# Patient Record
Sex: Female | Born: 1970 | Race: White | Hispanic: No | Marital: Married | Smoking: Never smoker
Health system: Southern US, Community
[De-identification: ages and names within clinical notes are randomized; demographics above are authoritative.]

## PROBLEM LIST (undated history)

## (undated) DIAGNOSIS — Z789 Other specified health status: Secondary | ICD-10-CM

## (undated) DIAGNOSIS — J189 Pneumonia, unspecified organism: Secondary | ICD-10-CM

## (undated) DIAGNOSIS — M5126 Other intervertebral disc displacement, lumbar region: Secondary | ICD-10-CM

## (undated) DIAGNOSIS — D649 Anemia, unspecified: Secondary | ICD-10-CM

## (undated) HISTORY — PX: TONSILLECTOMY: SUR1361

## (undated) HISTORY — PX: ABLATION: SHX5711

## (undated) HISTORY — PX: TUBAL LIGATION: SHX77

---

## 2004-10-13 ENCOUNTER — Inpatient Hospital Stay (HOSPITAL_COMMUNITY): Admission: AD | Admit: 2004-10-13 | Discharge: 2004-10-13 | Payer: Self-pay | Admitting: Obstetrics and Gynecology

## 2004-10-16 ENCOUNTER — Inpatient Hospital Stay (HOSPITAL_COMMUNITY): Admission: AD | Admit: 2004-10-16 | Discharge: 2004-10-16 | Payer: Self-pay | Admitting: Obstetrics and Gynecology

## 2006-03-14 ENCOUNTER — Inpatient Hospital Stay (HOSPITAL_COMMUNITY): Admission: AD | Admit: 2006-03-14 | Discharge: 2006-03-14 | Payer: Self-pay | Admitting: Obstetrics and Gynecology

## 2006-03-15 ENCOUNTER — Inpatient Hospital Stay (HOSPITAL_COMMUNITY): Admission: AD | Admit: 2006-03-15 | Discharge: 2006-03-15 | Payer: Self-pay | Admitting: Obstetrics and Gynecology

## 2006-03-24 ENCOUNTER — Inpatient Hospital Stay (HOSPITAL_COMMUNITY): Admission: AD | Admit: 2006-03-24 | Discharge: 2006-03-28 | Payer: Self-pay | Admitting: Obstetrics & Gynecology

## 2006-03-25 ENCOUNTER — Encounter (INDEPENDENT_AMBULATORY_CARE_PROVIDER_SITE_OTHER): Payer: Self-pay | Admitting: Specialist

## 2006-10-01 IMAGING — US US OB COMP LESS 14 WK
1 series · 13 of 28 positions shown · non-contrast
Comparison: none

CLINICAL DATA: 33- year-old with inappropriate rise in quantitative beta hCG.  uantitative beta hCG is 5244.  
OBSTETRICAL ULTRASOUND <14 WKS AND TRANSVAGINAL OB US:
TECHNIQUE: Both transabdominal and transvaginal ultrasound examinations were performed for complete evaluation of the gestation as well as the maternal uterus, adnexal regions, and pelvic cul-de-sac.

[Series 1: us ob comp less 14 wk · 0.29mm/px · 62 acquisitions, 13 frames shown]
[im 3/62]
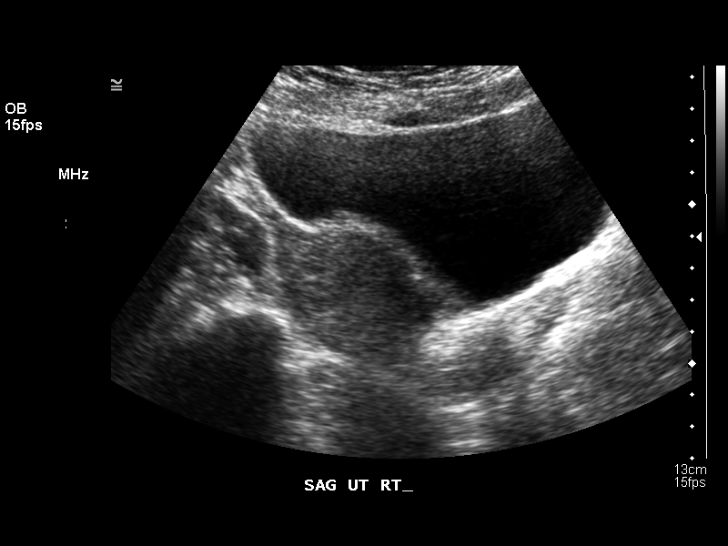
[im 7/62]
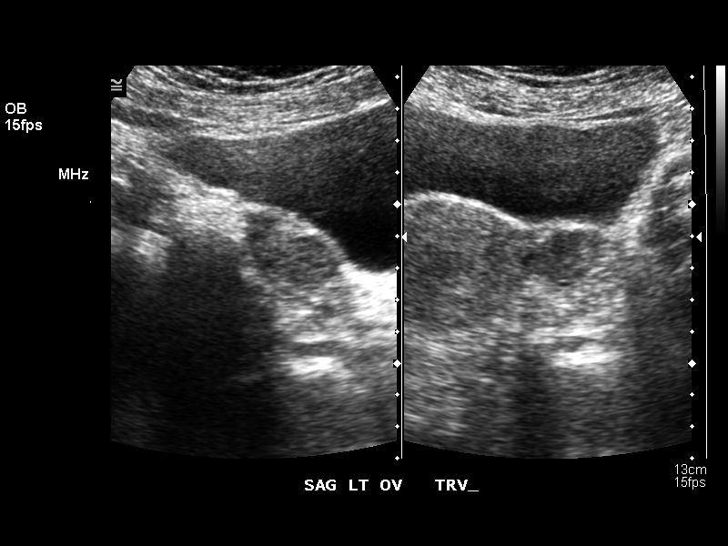
[im 12/62]
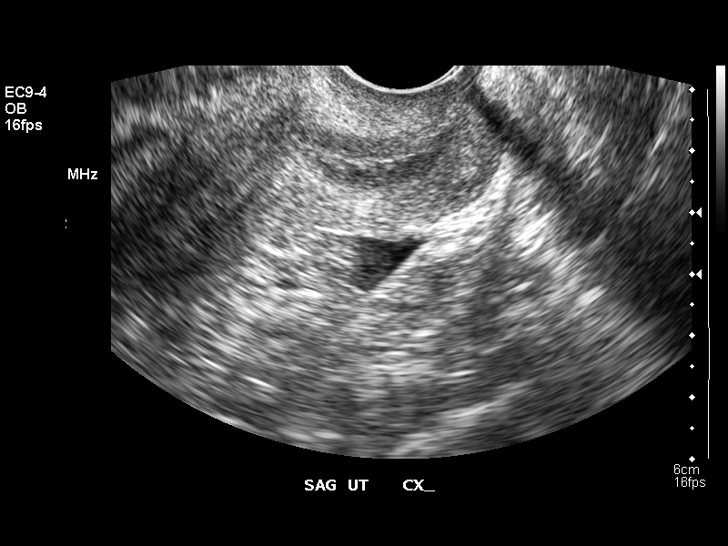
[im 16/62]
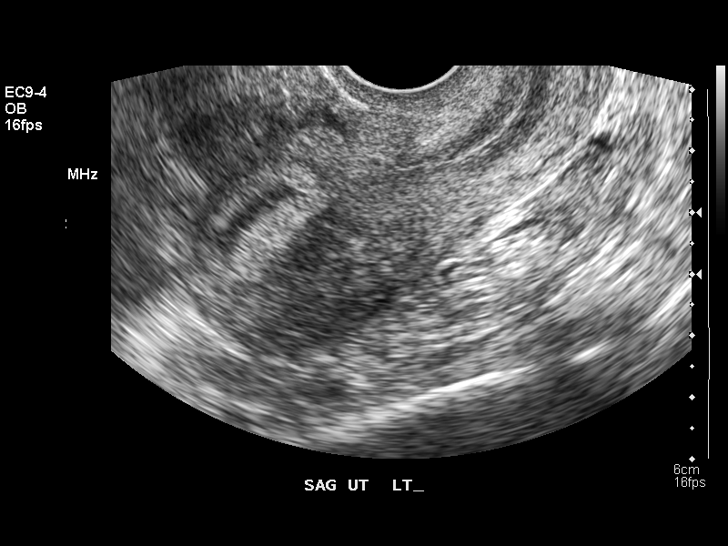
[im 21/62]
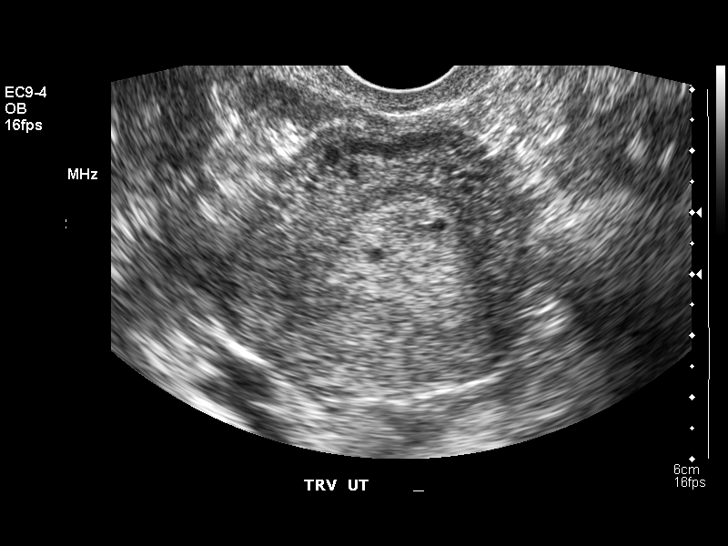
[im 25/62]
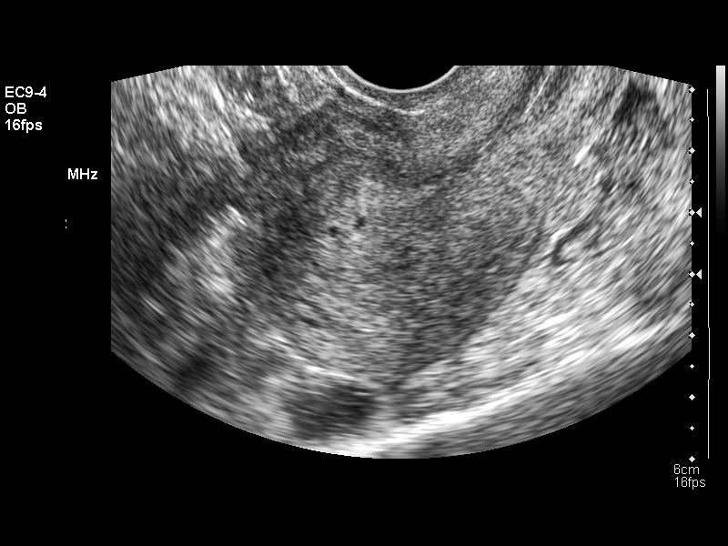
[im 32/62]
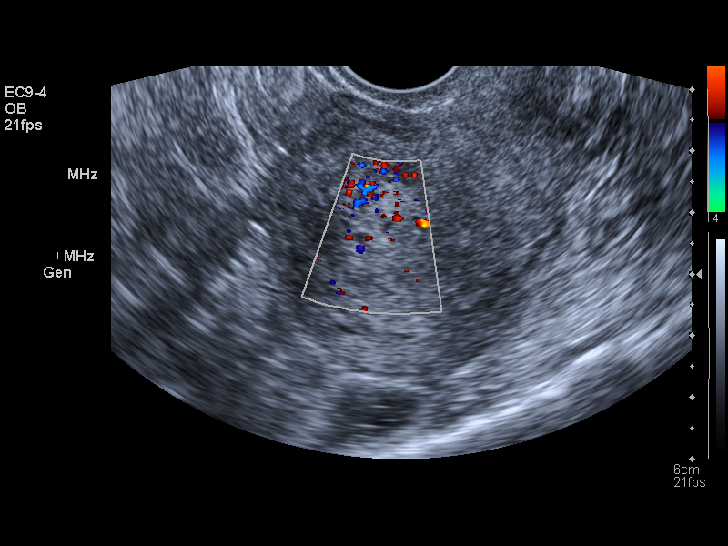
[im 37/62]
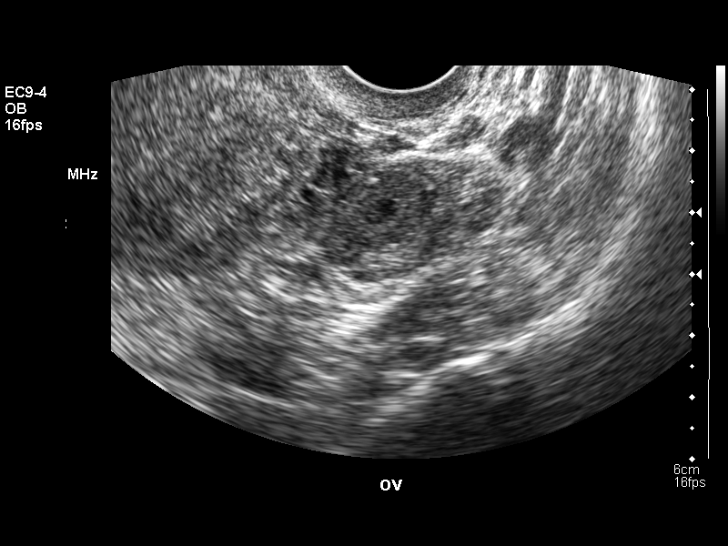
[im 41/62]
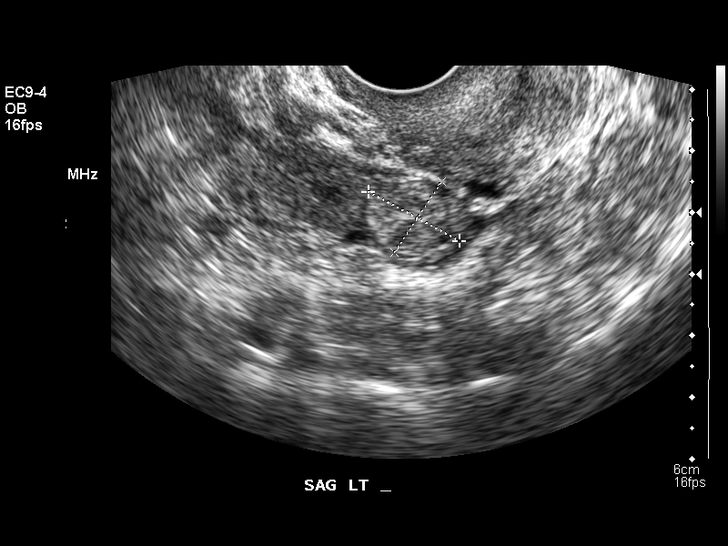
[im 46/62]
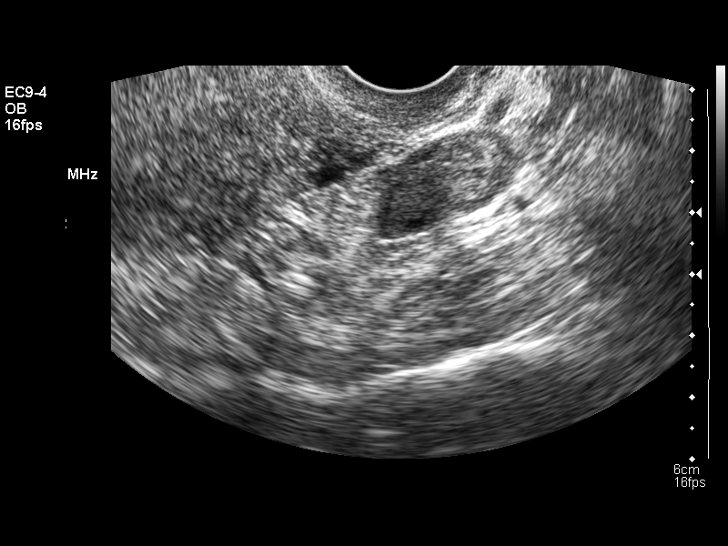
[im 50/62]
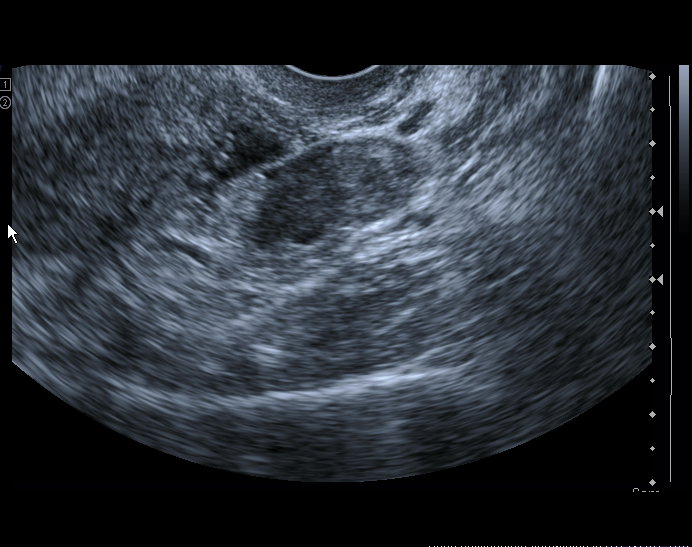
[im 55/62]
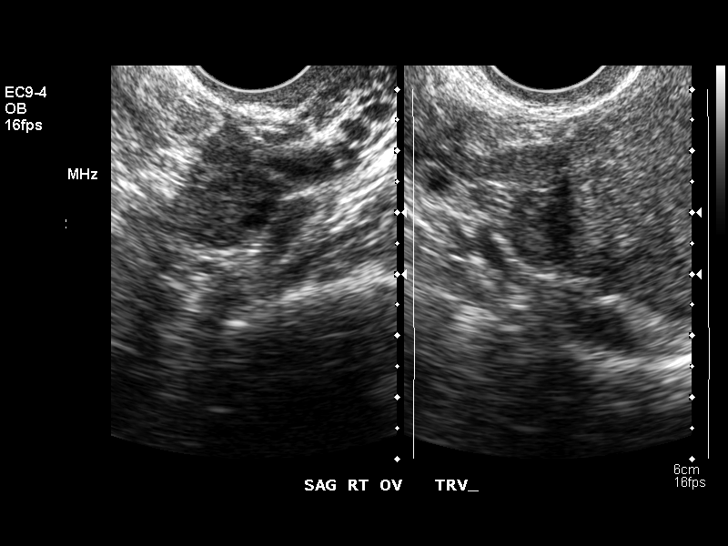
[im 59/62]
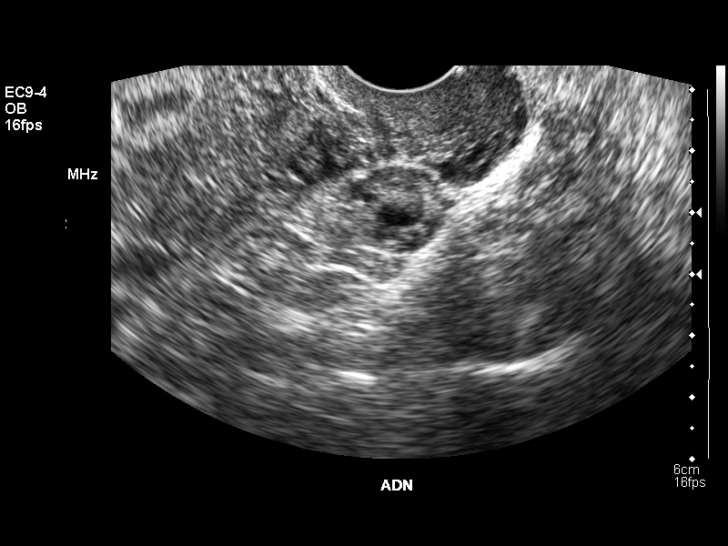

[13 of 28 positions shown; findings below may reference images not displayed]

FINDINGS: Within the uterus, there is decidual reaction.  Small amount of fluid is seen within the fundus measuring 16 x 4 x 11 mm.  No yolk sac or fetal pole is seen within this structure.
Within the left adnexa, there is probable corpus luteum cyst.  This is immediately adjacent to a small echogenic rounded structure measuring 17 x 14 mm.  It is difficult to determine whether these both represent different portions of the ovary, though I suspect that this is the case.  It is difficult, however, to exclude an ectopic pregnancy immediately adjacent to the otherwise normal-appearing ovary.  A sliver of free pelvic fluid is identified.  The right ovary is 11 x 17 x 11 mm.  Right ovary has a normal appearance.
IMPRESSION: There is a saclike structure within the uterus, but it is difficult to determine whether this is a true gestational sac.  In addition, the findings in the left adnexa do raise some concern for possible ectopic pregnancy.  Given the inappropriate rise in quantitative beta hCG, I am concerned regarding the possibility of ectopic pregnancy.  I would recommend close follow-up with quantitative beta hCGs and ultrasound exams.  Findings were discussed with Dr. Rabeesh.

## 2007-11-16 ENCOUNTER — Inpatient Hospital Stay (HOSPITAL_COMMUNITY): Admission: RE | Admit: 2007-11-16 | Discharge: 2007-11-18 | Payer: Self-pay | Admitting: Obstetrics and Gynecology

## 2009-06-22 ENCOUNTER — Inpatient Hospital Stay (HOSPITAL_COMMUNITY): Admission: AD | Admit: 2009-06-22 | Discharge: 2009-06-22 | Payer: Self-pay | Admitting: Obstetrics & Gynecology

## 2009-12-21 ENCOUNTER — Encounter (INDEPENDENT_AMBULATORY_CARE_PROVIDER_SITE_OTHER): Payer: Self-pay | Admitting: Obstetrics and Gynecology

## 2009-12-21 ENCOUNTER — Inpatient Hospital Stay (HOSPITAL_COMMUNITY): Admission: RE | Admit: 2009-12-21 | Discharge: 2009-12-24 | Payer: Self-pay | Admitting: Obstetrics and Gynecology

## 2010-04-09 LAB — SURGICAL PCR SCREEN
MRSA, PCR: NEGATIVE
Staphylococcus aureus: POSITIVE — AB

## 2010-04-09 LAB — TYPE AND SCREEN
ABO/RH(D): A POS
Antibody Screen: NEGATIVE

## 2010-04-09 LAB — CBC
HCT: 26 % — ABNORMAL LOW (ref 36.0–46.0)
HCT: 35.8 % — ABNORMAL LOW (ref 36.0–46.0)
MCH: 32.1 pg (ref 26.0–34.0)
MCH: 32.3 pg (ref 26.0–34.0)
MCHC: 34.2 g/dL (ref 30.0–36.0)
MCV: 93.8 fL (ref 78.0–100.0)
RBC: 3.81 MIL/uL — ABNORMAL LOW (ref 3.87–5.11)
RDW: 15 % (ref 11.5–15.5)

## 2010-04-09 LAB — ABO/RH: ABO/RH(D): A POS

## 2010-04-09 LAB — CCBB MATERNAL DONOR DRAW

## 2010-06-11 NOTE — Op Note (Signed)
Dana Bray, Dana Bray            ACCOUNT NO.:  [id number]   MEDICAL RECORD NO.:  [id number]          PATIENT TYPE:  INP   LOCATION:  9102                          [facility]ACILITY:  [facility]   PHYSICIAN:  Lenoard Aden, M.D.DATE OF BIRTH:  08-Mar-1970   DATE OF PROCEDURE:  11/16/2007  DATE OF DISCHARGE:                               OPERATIVE REPORT   PREOPERATIVE DIAGNOSES:  A 39-week intrauterine pregnancy, previous  cesarean section.   POSTOPERATIVE DIAGNOSES:  A 39-week intrauterine pregnancy, previous  cesarean section.   OPERATION:  Repeat low segment transverse cesarean section.   SURGEON:  Lenoard Aden, MD   ASSISTANT:  Marlinda Mike, C.N.M.   ANESTHESIA:  Spinal by Raul Del, M.D.   ESTIMATED BLOOD LOSS:  1000 mL.   COMPLICATIONS:  None.   DRAINS:  Foley.   COUNTS:  Correct.   The patient to recovery in good condition.   FINDINGS:  Full-term living female, occiput transverse position, Apgars of  8 and 9, normal tubes and normal ovaries, and two-layer uterine closure.   BRIEF OPERATIVE NOTE:  After being apprised of risks of anesthesia,  infection, bleeding, injury to abdominal organs, need for repair, the  labor's immediate complications to include bowel and bladder injury, the  patient was brought to the operating room where she was administered a  spinal anesthetic without complications, prepped and draped in the usual  sterile fashion.  Foley catheter was placed after achieving adequate  anesthesia and dilute Marcaine solution placed.  Transverse skin  incision made with a scalpel, carried down to fascia, which was nicked  in the midline transversely using Mayo scissors.  Rectus muscle was  dissected sharply in the midline.  Peritoneum was entered sharply and  bladder blade placed.  Visceral peritoneum scored sharply off the lower  uterine segment.  Kerr hysterotomy incision made.  Atraumatic delivery  of full-term living female from an occiput transverse  position, handed to  pediatricians team, Apgars 8 and 9.  Cord blood collected.  Placenta  removed manually, intact 3-vessel cord.  Uterus was curetted using a dry  lap pack and closed with 2 running imbricating layers of 0 Monocryl  suture.  Good hemostasis was noted.  Bladder flap inspected and found to  be hemostatic.  Irrigation accomplished.  Normal  tubes and normal ovaries inspected.  After this, the fascia was closed  using a 0 Monocryl in a running fashion.  Skin was closed using staples.  The patient tolerated the procedure well and was transferred to recovery  in good condition.      Lenoard Aden, M.D.  Electronically Signed     RJT/MEDQ  D:  11/16/2007  T:  11/17/2007  Job:  [id number]

## 2010-06-14 NOTE — Op Note (Signed)
[name], Dana Bray            ACCOUNT NO.:  [id number]   MEDICAL RECORD NO.:  [id number]          PATIENT TYPE:  INP   LOCATION:  9125                          FACILITY:  [facility]   PHYSICIAN:  Gerri Spore B. Earlene Plater, M.D.  DATE OF BIRTH:  1970-09-28   DATE OF PROCEDURE:  03/25/2006  DATE OF DISCHARGE:                               OPERATIVE REPORT   PREOPERATIVE DIAGNOSES:  1. A 40-week intrauterine pregnancy.  2. Severe pruritic urticaria papules of pregnancy (PUPPS), in active      phase arrest.   POSTOPERATIVE DIAGNOSIS:  1. A 40-week intrauterine pregnancy.  2. Severe pruritic urticaria papules of pregnancy (PUPPS), in active      phase arrest.   PROCEDURE:  Primary low transverse cesarean section.   SURGEON:  Chester Holstein. Earlene Plater, M.D.   ASSISTANT:  Marlinda Mike, C.N.M. and Merrilee Jansky, C.N.M. student.   ANESTHESIA:  Epidural.   SPECIMENS:  Placenta to pathology.   BLOOD LOSS:  800.   COMPLICATIONS:  None.   FINDINGS:  A viable female at 2045 hours.  Apgars 8 and 9.  The pH 7.34,  9 pounds, 3 ounces, OP position.   INDICATIONS:  Patient was being induced for the above history and had  initially progressed well to about 8 cm.  She was being augmented with  Pitocin with documented adequate MVUs by [facility].  She did not show any  progress for approximately eight hours; therefore, recommended cesarean  section.  The risks of surgery discussed, including infection, bleeding,  damage to tissue and surrounding organs, and VBAC issues in the future  also discussed.   PROCEDURE:  Patient was taken to the operating room with epidural  anesthesia in place.  The level was deemed adequate.  She was prepped  and draped in a standard fashion.  Foley catheter was in the bladder.  A  Pfannenstiel incision was made and carried sharply to the fascia.  The  fascia was divided sharply, and the underlying rectus muscles dissected  off sharply.  The posterior sheath of the peritoneum elevated and  entered sharply.  A bladder flap created sharply.  A bladder blade  inserted.  The uterine incision was made in a low transverse fashion  with a knife.  Light meconium-stained fluid noted in the amniotomy.  Incision extended laterally with bandage scissors.  The infant's vertex  was delivered through the incision and found to be about 10 degrees of  LOP position.  Nose and mouth suctioned with the DeLee.  The remainder  of the infant delivered without difficulty.  The cord was clamped and  cut.  The infant handed off to the waiting pediatrician.  Ancef 1 gm  given at cord clamp.   The placenta was removed by uterine massage.  The uterus exteriorized  and cleared of all clots and debris.  The uterine incision was free of  extension and closed in two layers with 0 chromic.  Hemostasis obtained.  Tubes and ovaries were inspected and appeared normal.   The uterus was returned to the abdomen.  The pelvis irrigated.  The  uterine incision and bladder  flap were hemostatic, as was the subfascial  space.  The fascia was closed in a running stitch of 0 Vicryl.  Subcutaneous tissue was irrigated and made hemostatic with the Bovie.  The skin was closed with staples.   The patient tolerated the procedure well with no complications.  She was  taken to the recovery room awake and alert in stable condition.  All  counts were correct per the operating room staff.      Gerri Spore B. Earlene Plater, M.D.  Electronically Signed     WBD/MEDQ  D:  03/25/2006  T:  03/25/2006  Job:  [id number]

## 2010-06-14 NOTE — Discharge Summary (Signed)
[name], Dana Bray            ACCOUNT NO.:  [id number]   MEDICAL RECORD NO.:  [id number]          PATIENT TYPE:  INP   LOCATION:  9102                          [facility]ILITY:  [facility]   PHYSICIAN:  Lenoard Aden, M.D.DATE OF BIRTH:  1970/07/20   DATE OF ADMISSION:  11/16/2007  DATE OF DISCHARGE:  11/18/2007                               DISCHARGE SUMMARY   The patient underwent a complicated repeat C-section on November 16, 2007.  Postoperative course was uncomplicated.  She tolerated that well.  Discharge teaching done.   DISCHARGE MEDICATIONS:  Reviewed.  Discharge medications to include  Tylox, prenatal vitamins, and iron.   LABORATORY DATA:  Stable.   FOLLOWUP:  Followup in the office in 4-6 weeks.      Lenoard Aden, M.D.  Electronically Signed     RJT/MEDQ  D:  12/21/2007  T:  12/22/2007  Job:  [id number]

## 2010-10-09 ENCOUNTER — Other Ambulatory Visit: Payer: Self-pay | Admitting: Obstetrics and Gynecology

## 2010-10-14 ENCOUNTER — Encounter (HOSPITAL_COMMUNITY): Payer: Self-pay | Admitting: *Deleted

## 2010-10-23 NOTE — H&P (Signed)
Dana Bray, Dana Bray            ACCOUNT NO.:  [id number]  MEDICAL RECORD NO.:  [id number]  LOCATION:                                 FACILITY:  PHYSICIAN:  Lenoard Aden, M.D.DATE OF BIRTH:  Mar 12, 1970  DATE OF ADMISSION: DATE OF DISCHARGE:                             HISTORY & PHYSICAL   OUTPATIENT SURGERY:  October 24, 2010.  CHIEF COMPLAINT:  Menorrhagia for definitive therapy.  HISTORY OF PRESENT ILLNESS:  A 40 year old white female G6, P2, history of C-section x2 who presents for diagnostic hysteroscopy, D and C, NovaSure ablation.  She has no known drug allergies.  Medications are none.  She has a family history of hypertension, MI, osteoarthritis, and colon cancer.  She has surgical history remarkable for 1 abortion, 2 term deliveries, 1 ectopic, and 1 spontaneous abortion.  She has a surgical history also remarkable for D and C and tonsillectomy.  PHYSICAL EXAMINATION:  GENERAL:  She is a well-developed, well-nourished white female in no acute distress. HEENT:  Normal. NECK:  Supple.  Full range of motion. LUNGS:  Clear. HEART:  Regular rhythm. ABDOMEN:  Soft and nontender. PELVIC:  Uterus to be normal size, shape, and contour anteflexed.  No adnexal mass.  IMPRESSION:  Refractory menorrhagia for definitive therapy. Contraceptive management with husband has had a vasectomy.  PLAN:  To proceed with diagnostic hysteroscopy, D and C, NovaSure endometrial ablation.  Risks of anesthesia, infection, bleeding, uterine perforation with possible need for repair was discussed, delayed versus immediate complications to include bowel and bladder injury secondary to uterine perforation noted.  The patient acknowledges and wishes to proceed.  Possible failure to completely cure menorrhagia is noted.  The patient acknowledges and wishes to proceed.     Lenoard Aden, M.D.     RJT/MEDQ  D:  10/23/2010  T:  10/23/2010  Job:  [id number]

## 2010-10-24 ENCOUNTER — Encounter (HOSPITAL_COMMUNITY): Payer: Self-pay | Admitting: Anesthesiology

## 2010-10-24 ENCOUNTER — Inpatient Hospital Stay (HOSPITAL_COMMUNITY): Payer: BC Managed Care – PPO | Admitting: Anesthesiology

## 2010-10-24 ENCOUNTER — Other Ambulatory Visit: Payer: Self-pay | Admitting: Obstetrics and Gynecology

## 2010-10-24 ENCOUNTER — Ambulatory Visit (HOSPITAL_COMMUNITY)
Admission: RE | Admit: 2010-10-24 | Discharge: 2010-10-24 | Disposition: A | Discharge status: Home / Self Care | Payer: BC Managed Care – PPO | Source: Ambulatory Visit | Attending: Obstetrics and Gynecology | Admitting: Obstetrics and Gynecology

## 2010-10-24 ENCOUNTER — Encounter (HOSPITAL_COMMUNITY)
Admission: RE | Disposition: A | Discharge status: Home / Self Care | Payer: Self-pay | Source: Ambulatory Visit | Attending: Obstetrics and Gynecology

## 2010-10-24 DIAGNOSIS — N840 Polyp of corpus uteri: Secondary | ICD-10-CM | POA: Insufficient documentation

## 2010-10-24 DIAGNOSIS — N920 Excessive and frequent menstruation with regular cycle: Secondary | ICD-10-CM | POA: Insufficient documentation

## 2010-10-24 HISTORY — DX: Other specified health status: Z78.9

## 2010-10-24 LAB — HCG, SERUM, QUALITATIVE: Preg, Serum: NEGATIVE

## 2010-10-24 LAB — CBC
MCHC: 32.5 g/dL (ref 30.0–36.0)
MCV: 89.8 fL (ref 78.0–100.0)
RBC: 4.49 MIL/uL (ref 3.87–5.11)

## 2010-10-24 SURGERY — DILATATION & CURETTAGE/HYSTEROSCOPY WITH NOVASURE ABLATION
Anesthesia: Monitor Anesthesia Care | Site: Vagina | Laterality: Right

## 2010-10-24 MED ORDER — BUPIVACAINE HCL (PF) 0.25 % IJ SOLN
INTRAMUSCULAR | Status: DC | PRN
  Administered 2010-10-24: 20 mL

## 2010-10-24 MED ORDER — LIDOCAINE HCL (CARDIAC) 20 MG/ML IV SOLN
INTRAVENOUS | Status: DC | PRN
  Administered 2010-10-24: 60 mg via INTRAVENOUS

## 2010-10-24 MED ORDER — MEPERIDINE HCL 25 MG/ML IJ SOLN: 6.2500 mg | INTRAMUSCULAR | Status: DC | PRN

## 2010-10-24 MED ORDER — DEXAMETHASONE SODIUM PHOSPHATE 10 MG/ML IJ SOLN
INTRAMUSCULAR | Status: AC
  Filled 2010-10-24: qty 1

## 2010-10-24 MED ORDER — FENTANYL CITRATE 0.05 MG/ML IJ SOLN
25.0000 ug | INTRAMUSCULAR | Status: DC | PRN
  Administered 2010-10-24: 50 ug via INTRAVENOUS

## 2010-10-24 MED ORDER — ONDANSETRON HCL 4 MG/2ML IJ SOLN
INTRAMUSCULAR | Status: AC
  Filled 2010-10-24: qty 2

## 2010-10-24 MED ORDER — MIDAZOLAM HCL 2 MG/2ML IJ SOLN
INTRAMUSCULAR | Status: AC
  Filled 2010-10-24: qty 2

## 2010-10-24 MED ORDER — MIDAZOLAM HCL 5 MG/5ML IJ SOLN
INTRAMUSCULAR | Status: DC | PRN
  Administered 2010-10-24: 2 mg via INTRAVENOUS

## 2010-10-24 MED ORDER — KETOROLAC TROMETHAMINE 60 MG/2ML IM SOLN
INTRAMUSCULAR | Status: AC
  Filled 2010-10-24: qty 2

## 2010-10-24 MED ORDER — KETOROLAC TROMETHAMINE 30 MG/ML IJ SOLN
INTRAMUSCULAR | Status: DC | PRN
  Administered 2010-10-24: 60 mg via INTRAVENOUS

## 2010-10-24 MED ORDER — ONDANSETRON HCL 4 MG/2ML IJ SOLN: 4.0000 mg | Freq: Once | INTRAMUSCULAR | Status: DC | PRN

## 2010-10-24 MED ORDER — LACTATED RINGERS IR SOLN
Status: DC | PRN
  Administered 2010-10-24: 3000 mL

## 2010-10-24 MED ORDER — PROPOFOL 10 MG/ML IV EMUL
INTRAVENOUS | Status: AC
  Filled 2010-10-24: qty 50

## 2010-10-24 MED ORDER — PROPOFOL 10 MG/ML IV EMUL
INTRAVENOUS | Status: DC | PRN
  Administered 2010-10-24 (×9): 20 mg via INTRAVENOUS

## 2010-10-24 MED ORDER — KETOROLAC TROMETHAMINE 30 MG/ML IJ SOLN: 15.0000 mg | Freq: Once | INTRAMUSCULAR | Status: DC | PRN

## 2010-10-24 MED ORDER — ONDANSETRON HCL 4 MG/2ML IJ SOLN
INTRAMUSCULAR | Status: DC | PRN
  Administered 2010-10-24: 4 mg via INTRAVENOUS

## 2010-10-24 MED ORDER — FENTANYL CITRATE 0.05 MG/ML IJ SOLN
INTRAMUSCULAR | Status: AC
  Administered 2010-10-24: 50 ug via INTRAVENOUS
  Filled 2010-10-24: qty 2

## 2010-10-24 MED ORDER — LIDOCAINE HCL (CARDIAC) 20 MG/ML IV SOLN
INTRAVENOUS | Status: AC
  Filled 2010-10-24: qty 5

## 2010-10-24 MED ORDER — LACTATED RINGERS IV SOLN
INTRAVENOUS | Status: DC
  Administered 2010-10-24 (×2): via INTRAVENOUS
  Administered 2010-10-24: 1000 mL via INTRAVENOUS
  Administered 2010-10-24: 07:00:00 via INTRAVENOUS

## 2010-10-24 MED ORDER — FENTANYL CITRATE 0.05 MG/ML IJ SOLN
INTRAMUSCULAR | Status: AC
  Filled 2010-10-24: qty 5

## 2010-10-24 MED ORDER — OXYCODONE-ACETAMINOPHEN 5-325 MG PO TABS: 1.0000 | ORAL_TABLET | ORAL | Status: AC | PRN

## 2010-10-24 MED ORDER — FENTANYL CITRATE 0.05 MG/ML IJ SOLN
INTRAMUSCULAR | Status: DC | PRN
  Administered 2010-10-24 (×3): 50 ug via INTRAVENOUS

## 2010-10-24 MED ORDER — DEXAMETHASONE SODIUM PHOSPHATE 4 MG/ML IJ SOLN
INTRAMUSCULAR | Status: DC | PRN
  Administered 2010-10-24: 5 mg via INTRAVENOUS

## 2010-10-24 SURGICAL SUPPLY — 14 items
ABLATOR ENDOMETRIAL BIPOLAR (ABLATOR) ×2 IMPLANT
CATH ROBINSON RED A/P 16FR (CATHETERS) ×2 IMPLANT
CLOTH BEACON ORANGE TIMEOUT ST (SAFETY) ×2 IMPLANT
CONTAINER PREFILL 10% NBF 60ML (FORM) ×4 IMPLANT
GLOVE BIO SURGEON STRL SZ7.5 (GLOVE) ×4 IMPLANT
GOWN PREVENTION PLUS LG XLONG (DISPOSABLE) ×2 IMPLANT
GOWN PREVENTION PLUS XLARGE (GOWN DISPOSABLE) ×2 IMPLANT
NDL SPNL 22GX3.5 QUINCKE BK (NEEDLE) ×1 IMPLANT
NEEDLE SPNL 22GX3.5 QUINCKE BK (NEEDLE) ×2 IMPLANT
PACK HYSTEROSCOPY LF (CUSTOM PROCEDURE TRAY) ×2 IMPLANT
PAD PREP 24X48 CUFFED NSTRL (MISCELLANEOUS) ×2 IMPLANT
SYR TB 1ML 25GX5/8 (SYRINGE) ×2 IMPLANT
TOWEL OR 17X24 6PK STRL BLUE (TOWEL DISPOSABLE) ×4 IMPLANT
WATER STERILE IRR 1000ML POUR (IV SOLUTION) ×2 IMPLANT

## 2010-10-24 NOTE — Anesthesia Preprocedure Evaluation (Signed)
Anesthesia Evaluation  Name, MR# and DOB Patient awake  General Assessment Comment  Reviewed: Allergy & Precautions, H&P , NPO status , Patient's Chart, lab work & pertinent test results, reviewed documented beta blocker date and time   History of Anesthesia Complications Negative for: history of anesthetic complications  Airway Mallampati: I TM Distance: >3 FB Neck ROM: full    Dental  (+) Teeth Intact   Pulmonary  clear to auscultation  breath sounds clear to auscultation none    Cardiovascular regular Normal    Neuro/Psych Negative Neurological ROS  Negative Psych ROS  GI/Hepatic/Renal negative GI ROS  negative Liver ROS  negative Renal ROS        Endo/Other  Negative Endocrine ROS (+)      Abdominal   Musculoskeletal   Hematology negative hematology ROS (+)   Peds  Reproductive/Obstetrics negative OB ROS    Anesthesia Other Findings             Anesthesia Physical Anesthesia Plan  ASA: I  Anesthesia Plan: MAC   Post-op Pain Management:    Induction:   Airway Management Planned:   Additional Equipment:   Intra-op Plan:   Post-operative Plan:   Informed Consent: I have reviewed the patients History and Physical, chart, labs and discussed the procedure including the risks, benefits and alternatives for the proposed anesthesia with the patient or authorized representative who has indicated his/her understanding and acceptance.   Dental Advisory Given  Plan Discussed with: CRNA and Surgeon  Anesthesia Plan Comments:         Anesthesia Quick Evaluation

## 2010-10-24 NOTE — Op Note (Signed)
NAMENUPUR, HOHMAN            ACCOUNT NO.:  [id number]  MEDICAL RECORD NO.:  [id number]  LOCATION:  [facility]                          [facility]ILITY[facility]  [facility]  PHYSICIAN:  Lenoard Aden, M.D.DATE OF BIRTH:  06-13-1970  DATE OF PROCEDURE:  10/24/2010 DATE OF DISCHARGE:                              OPERATIVE REPORT   After being apprised of risks of anesthesia, infection, bleeding, injury of abdominal organs, need for repair, delayed versus immediate complications including bowel and bladder injury, possible need for repair, the patient was brought to the operating room where she was administered general anesthetic without complications, prepped and draped in usual sterile fashion, catheterized, and bladder was empty. Exam under anesthesia reveals a small anteflexed uterus and no adnexal masses.  Speculum placed.  Cervix was easily grasped using a single- tooth tenaculum and dilute Marcaine solution was placed in a standard paracervical block 20 mL total.  Cervix was easily dilated up to a #23 Pratt dilator.  Hysteroscope placed.  Visualization reveals a sessile appearing endometrial polyp in the right fundal area.  This was grasped with endometrial polyp forceps and removed.  D and C is also performed in a 4 quadrant method revealing copious amounts of endometrial curettings which were also sent to Pathology.  Bilateral normal tubal ostia noted and no other structural defects were observed.  At this time, the NovaSure device was placed, seated in a proper fashion to a length of 6.6 and a width of 3.3 and CO2 test was then performed and initiated and found to be negative.  The procedure was then performed for a minute and 20 seconds.  The device was then removed and inspected and found to be intact.  Revisualization with hysteroscopy reveals cavity to be empty.  No evidence of endometrial polyp.  A well ablated endometrial cavity was noted.  No evidence of uterine perforation.   The hysteroscope was then removed.  The patient was awakened and transferred to recovery in good condition.     Lenoard Aden, M.D.     RJT/MEDQ  D:  10/24/2010  T:  10/24/2010  Job:  [id number]

## 2010-10-24 NOTE — Progress Notes (Signed)
No changes noted 

## 2010-10-24 NOTE — Op Note (Signed)
10/24/2010  8:08 AM  PATIENT:  Dana Bray  40 y.o. female  PRE-OPERATIVE DIAGNOSIS:  Menorrhagia(Refractory)  POST-OPERATIVE DIAGNOSIS:  Menorrhagia plus Endometrial polyp  PROCEDURE:  Procedure(s): DILATATION & CURETTAGE/HYSTEROSCOPY WITH NOVASURE ABLATION ENDOMETRIAL POLYPECTOMY  SURGEON:  Surgeon(s): Lenoard Aden, MD  PHYSICIAN ASSISTANT:   ASSISTANTS: none   ANESTHESIA:   local and general  ESTIMATED BLOOD LOSS: * No blood loss amount entered *   BLOOD ADMINISTERED:none  DRAINS: none   LOCAL MEDICATIONS USED:  MARCAINE 20CC  SPECIMEN:  Source of Specimen:  EMC with polyp  DISPOSITION OF SPECIMEN:  PATHOLOGY  COUNTS:  YES  TOURNIQUET:  * No tourniquets in log *  DICTATION #: [id number]  PLAN OF CARE: To RR then DC home  PATIENT DISPOSITION:  PACU - hemodynamically stable.

## 2010-10-24 NOTE — Preoperative (Signed)
Beta Blockers   Reason not to administer Beta Blockers:Not Applicable 

## 2010-10-24 NOTE — Transfer of Care (Signed)
Immediate Anesthesia Transfer of Care Note  Patient: Dana Bray  Procedure(s) Performed:  DILATATION & CURETTAGE/HYSTEROSCOPY WITH NOVASURE ABLATION  Patient Location: PACU  Anesthesia Type: MAC  Level of Consciousness: awake, alert , oriented and patient cooperative  Airway & Oxygen Therapy: Patient Spontanous Breathing and Patient connected to nasal cannula oxygen  Post-op Assessment: Report given to PACU RN and Post -op Vital signs reviewed and stable  Post vital signs: Reviewed and stable  Complications: No apparent anesthesia complications

## 2010-10-24 NOTE — Anesthesia Postprocedure Evaluation (Signed)
Anesthesia Post Note  Patient: Dana Bray  Procedure(s) Performed:  DILATATION & CURETTAGE/HYSTEROSCOPY WITH NOVASURE ABLATION  Anesthesia type: MAC  Patient location: PACU  Post pain: Pain level controlled  Post assessment: Post-op Vital signs reviewed  Last Vitals:  Filed Vitals:   10/24/10 0609  BP: 119/83  Pulse: 78  Temp: 98.6 F (37 C)  Resp: 18    Post vital signs: Reviewed  Level of consciousness: sedated  Complications: No apparent anesthesia complications

## 2010-10-28 LAB — CBC
Hemoglobin: 13
Platelets: 212
RBC: 2.91 — ABNORMAL LOW
RDW: 15
WBC: 9.1

## 2010-10-28 LAB — CCBB MATERNAL DONOR DRAW

## 2010-10-28 LAB — RPR: RPR Ser Ql: NONREACTIVE

## 2011-06-10 IMAGING — US US OB COMP LESS 14 WK
1 series · 14 of 22 positions shown · non-contrast
Comparison: None this gestation

days), unknown quantitative beta HCG, presenting with vaginal
bleeding.

OBSTETRIC <14 WK ULTRASOUND 06/22/2009:
TECHNIQUE: Transabdominal ultrasound was performed for evaluation
of the gestation as well as the maternal uterus and adnexal
regions.

[Series 1: us ob comp less 14 wks · 0.33mm/px · 14 of 22 slices shown]
[im 1/22]
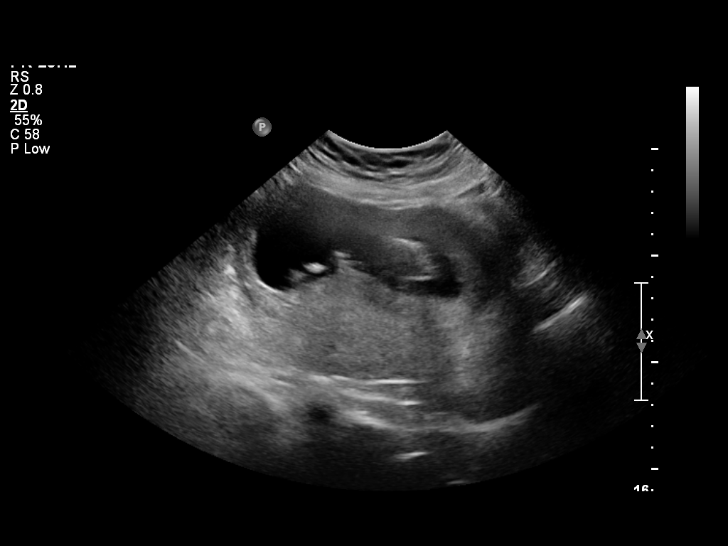
[im 3/22]
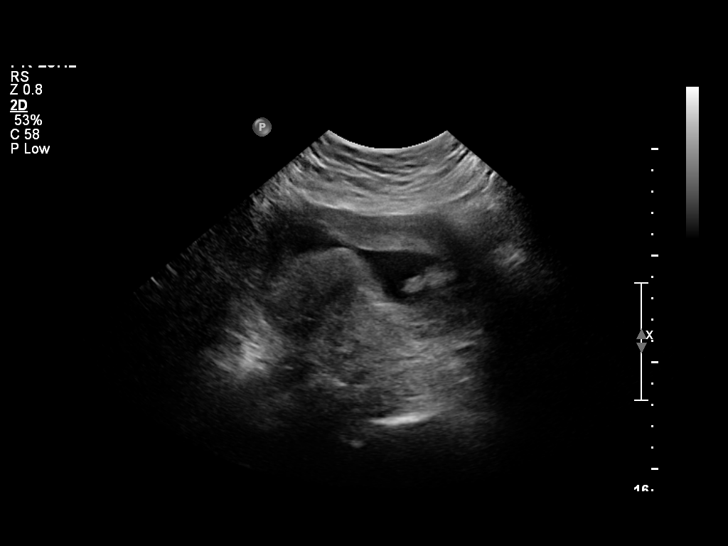
[im 4/22]
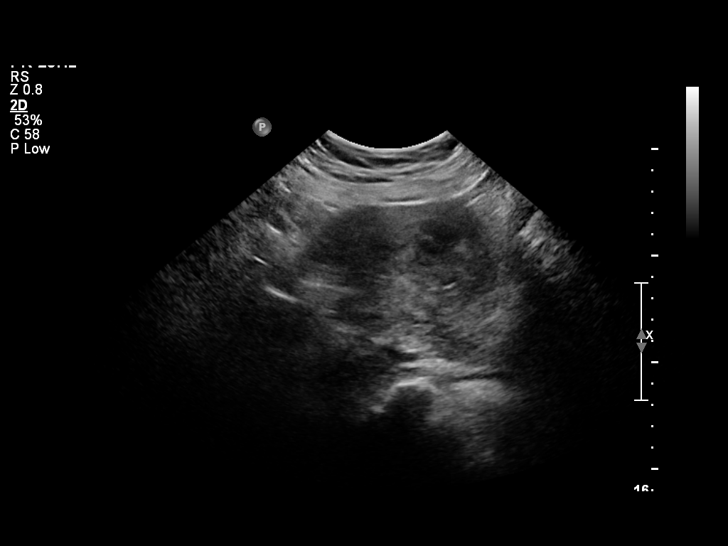
[im 6/22]
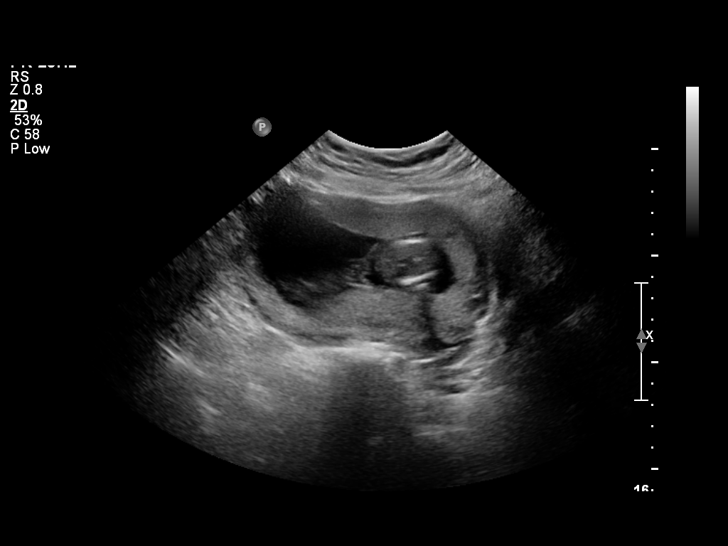
[im 8/22]
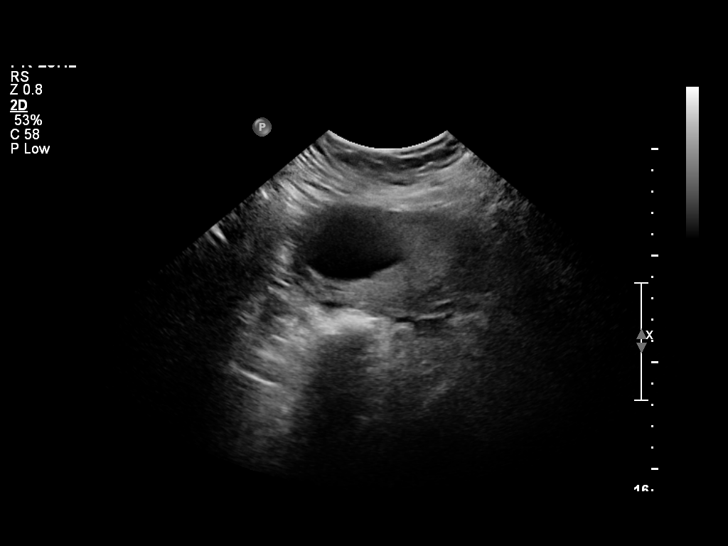
[im 9/22]
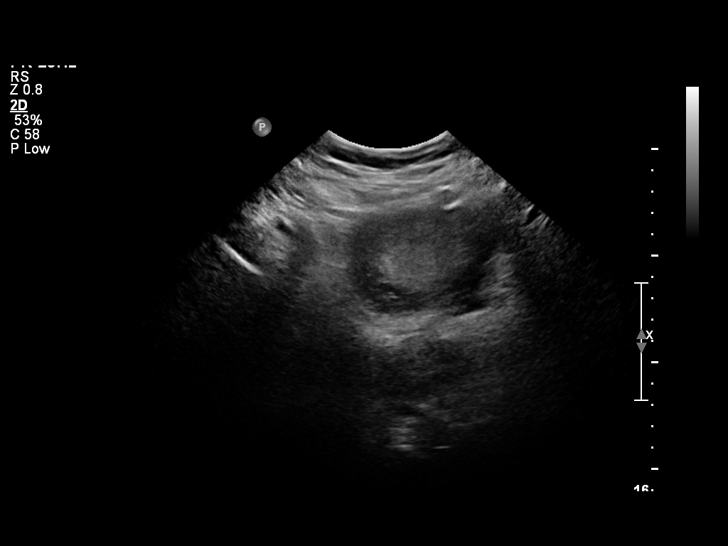
[im 11/22]
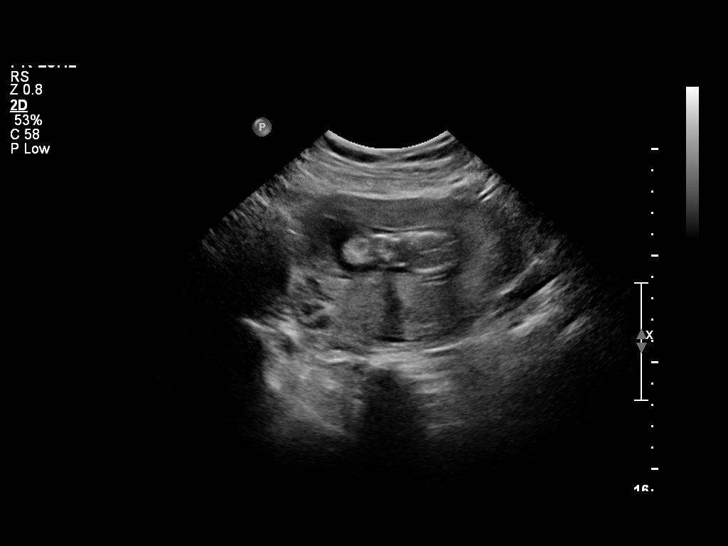
[im 12/22]
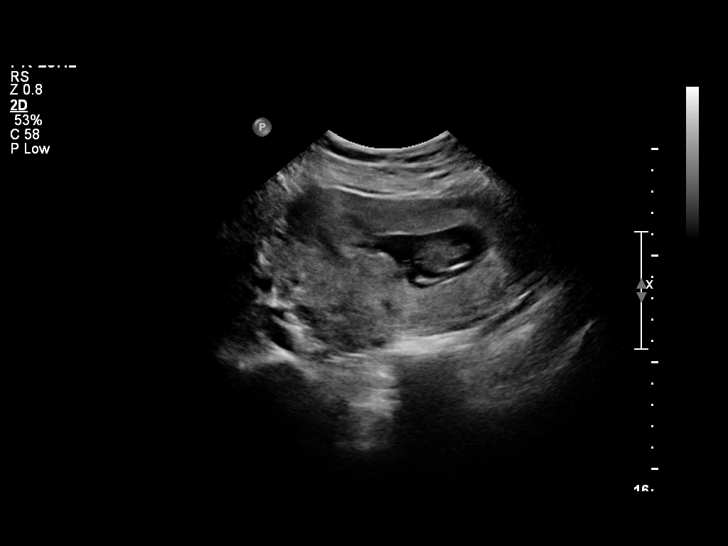
[im 14/22]
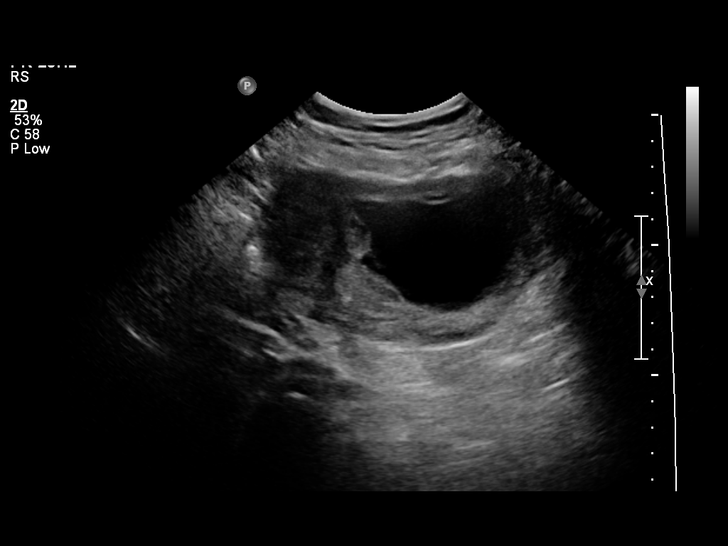
[im 15/22]
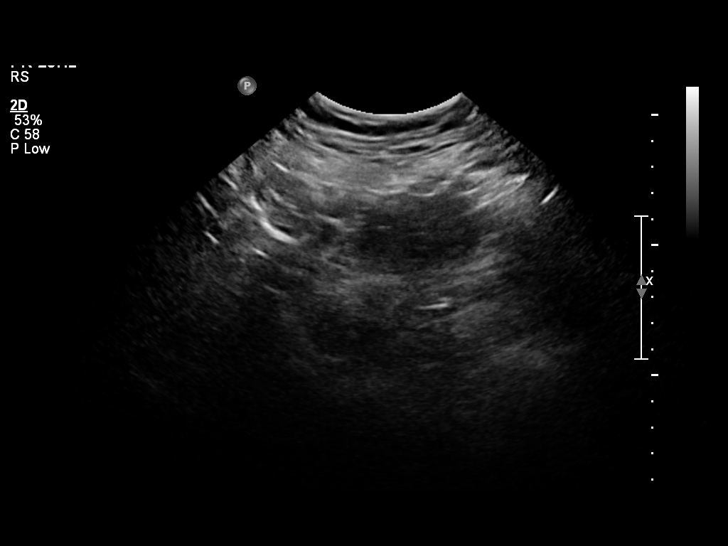
[im 17/22]
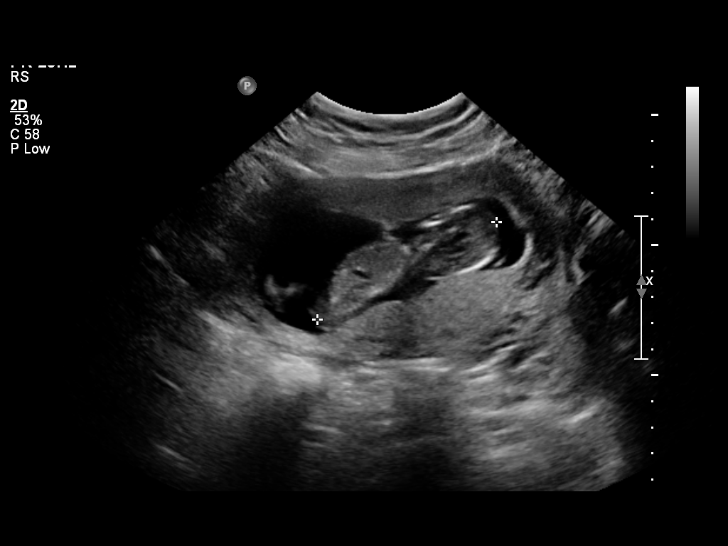
[im 19/22]
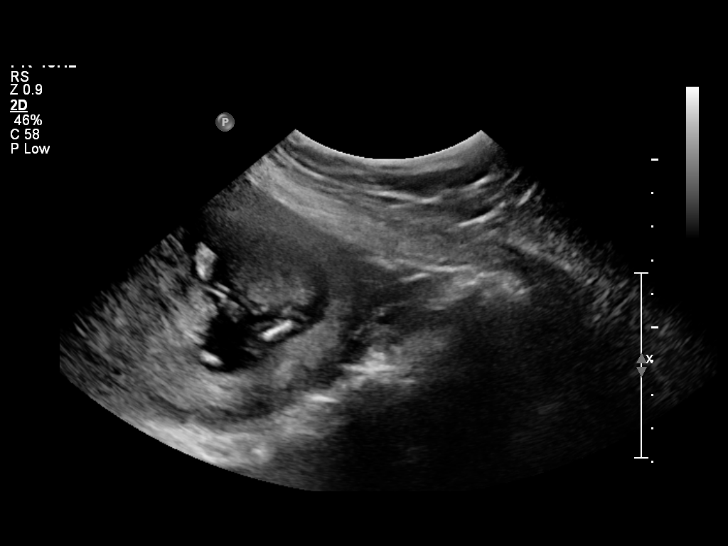
[im 20/22]
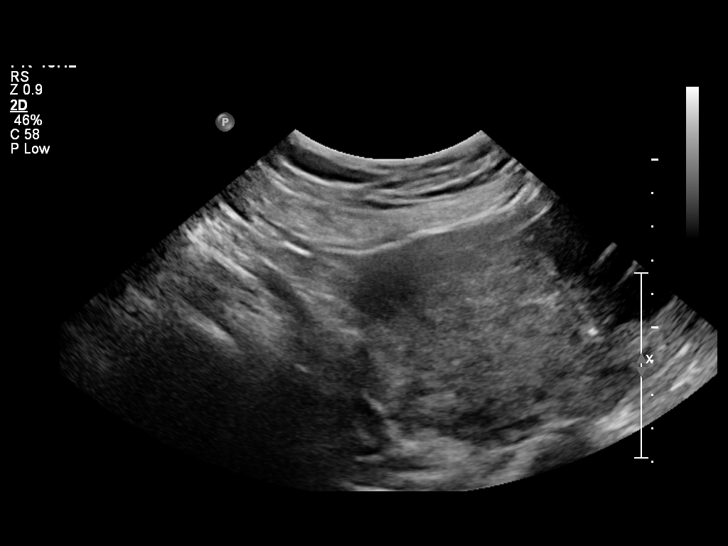
[im 22/22]
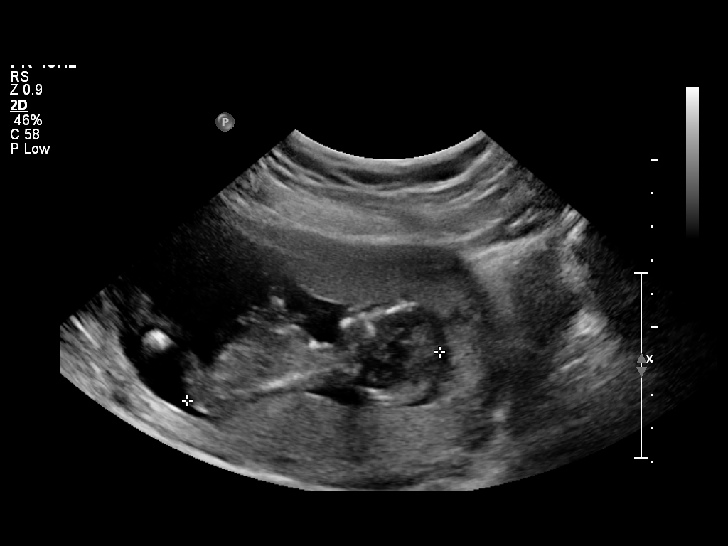

[14 of 22 positions shown; findings below may reference images not displayed]

Intrauterine gestational sac: Single.
Yolk sac: No longer visualized.
Fetus: Visualized.
Cardiac Activity: Visualized.
Heart Rate: 149 bpm

CRL:  7.55 cm         13w  5d       US EDC: 12/23/2009

Maternal uterus/Adnexae:
No evidence of subchorionic hemorrhage.  Neither the right or left
ovary was visualized transabdominally.  No free fluid.
IMPRESSION: 1.  Single live intrauterine fetus with estimated gestational age
13 weeks 5 days by crown-rump length, corresponding well with the
estimated gestational age by LMP.
2.  No subchorionic hemorrhage.
3.  Non-visualization of the ovaries.  No adnexal masses or free
fluid.

## 2014-01-14 ENCOUNTER — Encounter (HOSPITAL_COMMUNITY): Payer: Self-pay | Admitting: *Deleted

## 2014-01-14 ENCOUNTER — Emergency Department (HOSPITAL_COMMUNITY)
Admission: EM | Admit: 2014-01-14 | Discharge: 2014-01-14 | Disposition: A | Discharge status: Home / Self Care | Payer: 59 | Attending: Emergency Medicine | Admitting: Emergency Medicine

## 2014-01-14 ENCOUNTER — Emergency Department (HOSPITAL_COMMUNITY): Payer: 59

## 2014-01-14 DIAGNOSIS — K8020 Calculus of gallbladder without cholecystitis without obstruction: Secondary | ICD-10-CM | POA: Insufficient documentation

## 2014-01-14 DIAGNOSIS — Z8739 Personal history of other diseases of the musculoskeletal system and connective tissue: Secondary | ICD-10-CM | POA: Diagnosis not present

## 2014-01-14 DIAGNOSIS — R1011 Right upper quadrant pain: Secondary | ICD-10-CM

## 2014-01-14 HISTORY — DX: Other intervertebral disc displacement, lumbar region: M51.26

## 2014-01-14 LAB — COMPREHENSIVE METABOLIC PANEL
ALT: 21 U/L (ref 0–35)
AST: 17 U/L (ref 0–37)
Albumin: 3.9 g/dL (ref 3.5–5.2)
Alkaline Phosphatase: 44 U/L (ref 39–117)
Anion gap: 13 (ref 5–15)
BUN: 10 mg/dL (ref 6–23)
CALCIUM: 9.4 mg/dL (ref 8.4–10.5)
CO2: 24 meq/L (ref 19–32)
CREATININE: 0.59 mg/dL (ref 0.50–1.10)
Chloride: 100 mEq/L (ref 96–112)
GFR calc Af Amer: >90 mL/min (ref >90)
Glucose, Bld: 114 mg/dL — ABNORMAL HIGH (ref 70–99)
Potassium: 3.9 mEq/L (ref 3.7–5.3)
Sodium: 137 mEq/L (ref 137–147)
TOTAL PROTEIN: 7.1 g/dL (ref 6.0–8.3)
Total Bilirubin: 0.3 mg/dL (ref 0.3–1.2)

## 2014-01-14 LAB — CBC
HCT: 39.6 % (ref 36.0–46.0)
Hemoglobin: 12.8 g/dL (ref 12.0–15.0)
MCH: 29.5 pg (ref 26.0–34.0)
MCHC: 32.3 g/dL (ref 30.0–36.0)
MCV: 91.2 fL (ref 78.0–100.0)
PLATELETS: 228 10*3/uL (ref 150–400)
RBC: 4.34 MIL/uL (ref 3.87–5.11)
RDW: 13.5 % (ref 11.5–15.5)
WBC: 7.7 10*3/uL (ref 4.0–10.5)

## 2014-01-14 LAB — URINALYSIS, ROUTINE W REFLEX MICROSCOPIC
Bilirubin Urine: NEGATIVE
Glucose, UA: NEGATIVE mg/dL
Hgb urine dipstick: NEGATIVE
KETONES UR: NEGATIVE mg/dL
NITRITE: NEGATIVE
PH: 8 (ref 5.0–8.0)
PROTEIN: NEGATIVE mg/dL
Specific Gravity, Urine: 1.006 (ref 1.005–1.030)
Urobilinogen, UA: 0.2 mg/dL (ref 0.0–1.0)

## 2014-01-14 LAB — LIPASE, BLOOD: LIPASE: 20 U/L (ref 11–59)

## 2014-01-14 LAB — URINE MICROSCOPIC-ADD ON

## 2014-01-14 MED ORDER — ONDANSETRON 4 MG PO TBDP: ORAL_TABLET | ORAL | Status: DC

## 2014-01-14 MED ORDER — HYDROMORPHONE HCL 1 MG/ML IJ SOLN
1.0000 mg | Freq: Once | INTRAMUSCULAR | Status: AC
  Administered 2014-01-14: 1 mg via INTRAVENOUS
  Filled 2014-01-14: qty 1

## 2014-01-14 MED ORDER — ONDANSETRON HCL 4 MG/2ML IJ SOLN
4.0000 mg | Freq: Once | INTRAMUSCULAR | Status: AC
Start: 2014-01-14 — End: 2014-01-14
  Administered 2014-01-14: 4 mg via INTRAVENOUS
  Filled 2014-01-14: qty 2

## 2014-01-14 MED ORDER — GI COCKTAIL ~~LOC~~
30.0000 mL | Freq: Once | ORAL | Status: AC
  Administered 2014-01-14: 30 mL via ORAL
  Filled 2014-01-14: qty 30

## 2014-01-14 MED ORDER — OXYCODONE-ACETAMINOPHEN 5-325 MG PO TABS: 1.0000 | ORAL_TABLET | Freq: Four times a day (QID) | ORAL | Status: DC | PRN

## 2014-01-14 NOTE — ED Notes (Signed)
Pt reports RUQ pain starting at 1am today. +nausea, no vomiting.

## 2014-01-14 NOTE — ED Provider Notes (Signed)
CSN: [id number]     Arrival date & time 01/14/14  0716 History   First MD Initiated Contact with Patient 01/14/14 [phone]     Chief Complaint  Patient presents with  . Abdominal Pain  . Nausea     (Consider location/radiation/quality/duration/timing/severity/associated sxs/prior Treatment) Patient is a 43 y.o. female presenting with abdominal pain.  Abdominal Pain Pain location:  RUQ Pain quality: aching and sharp   Pain radiates to:  Does not radiate Pain severity:  Moderate Onset quality:  Sudden Timing:  Constant Progression:  Unchanged Chronicity:  New Context: not previous surgeries, not recent illness, not sick contacts and not trauma   Relieved by:  Nothing Worsened by:  Nothing tried Associated symptoms: nausea   Associated symptoms: no chest pain, no chills, no cough, no diarrhea, no fever, no shortness of breath and no vomiting     Past Medical History  Diagnosis Date  . No pertinent past medical history   . Lumbar herniated disc    Past Surgical History  Procedure Laterality Date  . Cesarean section    . Tonsillectomy      as child   No family history on file. History  Substance Use Topics  . Smoking status: Never Smoker   . Smokeless tobacco: Not on file  . Alcohol Use: Yes     Comment: once a month   OB History    No data available     Review of Systems  Constitutional: Negative for fever and chills.  Respiratory: Negative for cough and shortness of breath.   Cardiovascular: Negative for chest pain and leg swelling.  Gastrointestinal: Positive for nausea and abdominal pain. Negative for vomiting and diarrhea.  All other systems reviewed and are negative.     Allergies  Other  Home Medications   Prior to Admission medications   Medication Sig Start Date End Date Taking? Authorizing Provider  bismuth subsalicylate (PEPTO BISMOL) 262 MG/15ML suspension Take 30 mLs by mouth every 6 (six) hours as needed for indigestion.   Yes Historical  Provider, MD  traMADol (ULTRAM) 50 MG tablet Take 50 mg by mouth every 6 (six) hours as needed for moderate pain.   Yes Historical Provider, MD  ondansetron (ZOFRAN ODT) 4 MG disintegrating tablet 1 tab sublingual q6h PRN nausea/vomiting 01/14/14   Elwin MochaBlair Cregg Jutte, MD  oxyCODONE-acetaminophen (PERCOCET) 5-325 MG per tablet Take 1 tablet by mouth every 6 (six) hours as needed for moderate pain. 01/14/14   Elwin MochaBlair Coby Shrewsberry, MD   BP 117/82 mmHg  Pulse 61  Temp(Src) 98.2 F (36.8 C) (Oral)  Resp 16  SpO2 97% Physical Exam  Constitutional: She is oriented to person, place, and time. She appears well-developed and well-nourished. No distress.  HENT:  Head: Normocephalic and atraumatic.  Mouth/Throat: Oropharynx is clear and moist.  Eyes: EOM are normal. Pupils are equal, round, and reactive to light.  Neck: Normal range of motion. Neck supple.  Cardiovascular: Normal rate and regular rhythm.  Exam reveals no friction rub.   No murmur heard. Pulmonary/Chest: Effort normal and breath sounds normal. No respiratory distress. She has no wheezes. She has no rales.  Abdominal: Soft. She exhibits no distension. There is tenderness (RUQ, +Murphy's). There is no rebound.  Musculoskeletal: Normal range of motion. She exhibits no edema.  Neurological: She is alert and oriented to person, place, and time.  Skin: Skin is warm. No rash noted. She is not diaphoretic.  Nursing note and vitals reviewed.   ED Course  Procedures (including  critical care time) Labs Review Labs Reviewed  COMPREHENSIVE METABOLIC PANEL - Abnormal; Notable for the following:    Glucose, Bld 114 (*)    All other components within normal limits  URINALYSIS, ROUTINE W REFLEX MICROSCOPIC - Abnormal; Notable for the following:    Leukocytes, UA SMALL (*)    All other components within normal limits  CBC  LIPASE, BLOOD  URINE MICROSCOPIC-ADD ON    Imaging Review [place] Abdomen Limited Ruq  01/14/2014   CLINICAL DATA:  Right upper  quadrant pain.  EXAM: US ABDOMEN LIMITED - RIGHT UPPER QUADRANT  COMPARISON:  None.  FINDINGS: Gallbladder:  Echogenic stones in the gallbladder. Largest stone measures 1.2 cm. The patient does not have a sonographic Murphy's sign. There is no significant gallbladder wall thickening. Small amount of echogenic sludge within the gallbladder.  Common bile duct:  Diameter: 0.5 cm.  Liver:  No focal lesion identified. Within normal limits in parenchymal echogenicity.  IMPRESSION: Cholelithiasis.  No evidence for biliary dilatation.   Electronically Signed   By: Richarda OverlieAdam  Henn M.D.   On: 01/14/2014 09:21     EKG Interpretation None      MDM   Final diagnoses:  RUQ pain  Calculus of gallbladder without cholecystitis without obstruction    [id number] - 16F here with RUQ pain. Began about 7 hours ago. No alleviating/exacerbating factors. Vitals stable. RUQ pain, +Murphy's on exam. No other abdominal pain. RUQ US ordered.\ RUQ US shows cholelithiasis, no evidence of cholecysitits. On reexam at 0945, patient doing well, relaxing comfortably, pain under control. Given pain and nausea medication, CCS f/u.    Elwin MochaBlair Dilon Lank, MD 01/14/14 1001

## 2014-01-14 NOTE — ED Notes (Signed)
MD Walden at bedside 

## 2014-01-14 NOTE — Discharge Instructions (Signed)
Cholelithiasis °Cholelithiasis (also called gallstones) is a form of gallbladder disease in which gallstones form in your gallbladder. The gallbladder is an organ that stores bile made in the liver, which helps digest fats. Gallstones begin as small crystals and slowly grow into stones. Gallstone pain occurs when the gallbladder spasms and a gallstone is blocking the duct. Pain can also occur when a stone passes out of the duct.  °RISK FACTORS °· Being female.   °· Having multiple pregnancies. Health care providers sometimes advise removing diseased gallbladders before future pregnancies.   °· Being obese. °· Eating a diet heavy in fried foods and fat.   °· Being older than 60 years and increasing age.   °· Prolonged use of medicines containing female hormones.   °· Having diabetes mellitus.   °· Rapidly losing weight.   °· Having a family history of gallstones (heredity).   °SYMPTOMS °· Nausea.   °· Vomiting. °· Abdominal pain.   °· Yellowing of the skin (jaundice).   °· Sudden pain. It may persist from several minutes to several hours. °· Fever.   °· Tenderness to the touch.  °In some cases, when gallstones do not move into the bile duct, people have no pain or symptoms. These are called "silent" gallstones.  °TREATMENT °Silent gallstones do not need treatment. In severe cases, emergency surgery may be required. Options for treatment include: °· Surgery to remove the gallbladder. This is the most common treatment. °· Medicines. These do not always work and may take 6-12 months or more to work. °· Shock wave treatment (extracorporeal biliary lithotripsy). In this treatment an ultrasound machine sends shock waves to the gallbladder to break gallstones into smaller pieces that can pass into the intestines or be dissolved by medicine. °HOME CARE INSTRUCTIONS  °· Only take over-the-counter or prescription medicines for pain, discomfort, or fever as directed by your health care provider.   °· Follow a low-fat diet until  seen again by your health care provider. Fat causes the gallbladder to contract, which can result in pain.   °· Follow up with your health care provider as directed. Attacks are almost always recurrent and surgery is usually required for permanent treatment.   °SEEK IMMEDIATE MEDICAL CARE IF:  °· Your pain increases and is not controlled by medicines.   °· You have a fever or persistent symptoms for more than 2-3 days.   °· You have a fever and your symptoms suddenly get worse.   °· You have persistent nausea and vomiting.   °MAKE SURE YOU:  °· Understand these instructions. °· Will watch your condition. °· Will get help right away if you are not doing well or get worse. °Document Released: 01/09/2005 Document Revised: 09/15/2012 Document Reviewed: 07/07/2012 °Exit[employer]® Patient Information ©2015 [employer]. This information is not intended to replace advice given to you by your health care provider. Make sure you discuss any questions you have with your health care provider. ° °

## 2014-01-14 NOTE — ED Notes (Signed)
Pt aware of the need for a urine sample. 

## 2014-01-17 ENCOUNTER — Other Ambulatory Visit (INDEPENDENT_AMBULATORY_CARE_PROVIDER_SITE_OTHER): Payer: Self-pay | Admitting: General Surgery

## 2014-02-07 NOTE — Pre-Procedure Instructions (Signed)
Dana Bray  02/07/2014   Your procedure is scheduled on:  Friday, January 22nd  [facility] at 530 AM.  Call this number if you have problems the morning of surgery: [phone]   Remember:   Do not eat food or drink liquids after midnight.   Take these medicines the morning of surgery with A SIP OF WATER: percocet or ultram if needed   Do not wear jewelry, make-up or nail polish.  Do not wear lotions, powders, or perfumes, deodorant.  Do not shave 48 hours prior to surgery. Men may shave face and neck.  Do not bring valuables to the hospital.  [facility] is not responsible for any belongings or valuables.               Contacts, dentures or bridgework may not be worn into surgery.  Leave suitcase in the car. After surgery it may be brought to your room.  For patients admitted to the hospital, discharge time is determined by your  treatment team.               Patients discharged the day of surgery will not be allowed to drive home.  Please read over the following fact sheets that you were given: Pain Booklet, Coughing and Deep Breathing and Surgical Site Infection Prevention  [place] - Preparing for Surgery  Before surgery, you can play an important role.  Because skin is not sterile, your skin needs to be as free of germs as possible.  You can reduce the number of germs on you skin by washing with CHG (chlorahexidine gluconate) soap before surgery.  CHG is an antiseptic cleaner which kills germs and bonds with the skin to continue killing germs even after washing.  Please DO NOT use if you have an allergy to CHG or antibacterial soaps.  If your skin becomes reddened/irritated stop using the CHG and inform your nurse when you arrive at Short Stay.  Do not shave (including legs and underarms) for at least 48 hours prior to the first CHG shower.  You may shave your face.  Please follow these instructions carefully:   1.  Shower with CHG  Soap the night before surgery and the morning of Surgery.  2.  If you choose to wash your hair, wash your hair first as usual with your normal shampoo.  3.  After you shampoo, rinse your hair and body thoroughly to remove the shampoo.  4.  Use CHG as you would any other liquid soap.  You can apply CHG directly to the skin and wash gently with scrungie or a clean washcloth.  5.  Apply the CHG Soap to your body ONLY FROM THE NECK DOWN.  Do not use on open wounds or open sores.  Avoid contact with your eyes, ears, mouth and genitals (private parts).  Wash genitals (private parts) with your normal soap.  6.  Wash thoroughly, paying special attention to the area where your surgery will be performed.  7.  Thoroughly rinse your body with warm water from the neck down.  8.  DO NOT shower/wash with your normal soap after using and rinsing off the CHG Soap.  9.  Pat yourself dry with a clean towel.            10.  Wear clean pajamas.            11.  Place clean sheets on your bed the night of your first  shower and do not sleep with pets.  Day of Surgery  Do not apply any lotions/deoderants the morning of surgery.  Please wear clean clothes to the hospital/surgery center.

## 2014-02-08 ENCOUNTER — Encounter (HOSPITAL_COMMUNITY): Payer: Self-pay

## 2014-02-08 ENCOUNTER — Encounter (HOSPITAL_COMMUNITY)
Admission: RE | Admit: 2014-02-08 | Discharge: 2014-02-08 | Disposition: A | Discharge status: Home / Self Care | Payer: 59 | Source: Ambulatory Visit | Attending: General Surgery | Admitting: General Surgery

## 2014-02-08 DIAGNOSIS — Z01812 Encounter for preprocedural laboratory examination: Secondary | ICD-10-CM | POA: Insufficient documentation

## 2014-02-08 DIAGNOSIS — Z01818 Encounter for other preprocedural examination: Secondary | ICD-10-CM | POA: Diagnosis not present

## 2014-02-08 DIAGNOSIS — K8080 Other cholelithiasis without obstruction: Secondary | ICD-10-CM | POA: Insufficient documentation

## 2014-02-08 HISTORY — DX: Anemia, unspecified: D64.9

## 2014-02-08 HISTORY — DX: Pneumonia, unspecified organism: J18.9

## 2014-02-08 LAB — CBC
HCT: 38.4 % (ref 36.0–46.0)
Hemoglobin: 13 g/dL (ref 12.0–15.0)
MCH: 30.3 pg (ref 26.0–34.0)
MCHC: 33.9 g/dL (ref 30.0–36.0)
MCV: 89.5 fL (ref 78.0–100.0)
Platelets: 237 10*3/uL (ref 150–400)
RBC: 4.29 MIL/uL (ref 3.87–5.11)
RDW: 13.8 % (ref 11.5–15.5)
WBC: 6.6 10*3/uL (ref 4.0–10.5)

## 2014-02-08 LAB — BASIC METABOLIC PANEL
Anion gap: 5 (ref 5–15)
BUN: 11 mg/dL (ref 6–23)
CO2: 27 mmol/L (ref 19–32)
Calcium: 9.3 mg/dL (ref 8.4–10.5)
Chloride: 102 mEq/L (ref 96–112)
Creatinine, Ser: 0.59 mg/dL (ref 0.50–1.10)
Glucose, Bld: 87 mg/dL (ref 70–99)
POTASSIUM: 4.1 mmol/L (ref 3.5–5.1)
SODIUM: 134 mmol/L — AB (ref 135–145)

## 2014-02-08 LAB — HCG, SERUM, QUALITATIVE: PREG SERUM: NEGATIVE

## 2014-02-08 NOTE — Progress Notes (Signed)
Denies having a PCP. Denies seeing a cardiologist. Denies ever having a card cath, stress test, or echo.

## 2014-02-16 MED ORDER — CEFAZOLIN SODIUM-DEXTROSE 2-3 GM-% IV SOLR
2.0000 g | INTRAVENOUS | Status: AC
  Administered 2014-02-17: 2 g via INTRAVENOUS
  Filled 2014-02-16: qty 50

## 2014-02-17 ENCOUNTER — Ambulatory Visit (HOSPITAL_COMMUNITY): Payer: 59 | Admitting: Anesthesiology

## 2014-02-17 ENCOUNTER — Encounter (HOSPITAL_COMMUNITY): Payer: Self-pay | Admitting: *Deleted

## 2014-02-17 ENCOUNTER — Ambulatory Visit (HOSPITAL_COMMUNITY)
Admission: RE | Admit: 2014-02-17 | Discharge: 2014-02-18 | Disposition: A | Discharge status: Home / Self Care | Payer: 59 | Source: Ambulatory Visit | Attending: General Surgery | Admitting: General Surgery

## 2014-02-17 ENCOUNTER — Ambulatory Visit (HOSPITAL_COMMUNITY): Payer: 59

## 2014-02-17 ENCOUNTER — Encounter (HOSPITAL_COMMUNITY)
Admission: RE | Disposition: A | Discharge status: Home / Self Care | Payer: Self-pay | Source: Ambulatory Visit | Attending: General Surgery

## 2014-02-17 DIAGNOSIS — K8010 Calculus of gallbladder with chronic cholecystitis without obstruction: Secondary | ICD-10-CM | POA: Diagnosis present

## 2014-02-17 DIAGNOSIS — K8020 Calculus of gallbladder without cholecystitis without obstruction: Secondary | ICD-10-CM | POA: Diagnosis present

## 2014-02-17 DIAGNOSIS — Z9109 Other allergy status, other than to drugs and biological substances: Secondary | ICD-10-CM | POA: Diagnosis not present

## 2014-02-17 DIAGNOSIS — Z91018 Allergy to other foods: Secondary | ICD-10-CM | POA: Insufficient documentation

## 2014-02-17 HISTORY — PX: CHOLECYSTECTOMY: SHX55

## 2014-02-17 SURGERY — LAPAROSCOPIC CHOLECYSTECTOMY WITH INTRAOPERATIVE CHOLANGIOGRAM
Anesthesia: General

## 2014-02-17 MED ORDER — LIDOCAINE HCL (CARDIAC) 20 MG/ML IV SOLN
INTRAVENOUS | Status: AC
  Filled 2014-02-17: qty 5

## 2014-02-17 MED ORDER — PROPOFOL 10 MG/ML IV BOLUS
INTRAVENOUS | Status: DC | PRN
  Administered 2014-02-17: 170 mg via INTRAVENOUS

## 2014-02-17 MED ORDER — PHENYLEPHRINE 40 MCG/ML (10ML) SYRINGE FOR IV PUSH (FOR BLOOD PRESSURE SUPPORT)
PREFILLED_SYRINGE | INTRAVENOUS | Status: AC
  Filled 2014-02-17: qty 10

## 2014-02-17 MED ORDER — 0.9 % SODIUM CHLORIDE (POUR BTL) OPTIME
TOPICAL | Status: DC | PRN
  Administered 2014-02-17: 1000 mL

## 2014-02-17 MED ORDER — OXYCODONE-ACETAMINOPHEN 5-325 MG PO TABS: 1.0000 | ORAL_TABLET | ORAL | Status: DC | PRN

## 2014-02-17 MED ORDER — FENTANYL CITRATE 0.05 MG/ML IJ SOLN
INTRAMUSCULAR | Status: AC
  Filled 2014-02-17: qty 5

## 2014-02-17 MED ORDER — MIDAZOLAM HCL 5 MG/5ML IJ SOLN
INTRAMUSCULAR | Status: DC | PRN
  Administered 2014-02-17: 2 mg via INTRAVENOUS

## 2014-02-17 MED ORDER — HYDROMORPHONE HCL 1 MG/ML IJ SOLN
0.5000 mg | INTRAMUSCULAR | Status: DC | PRN
  Administered 2014-02-17: 0.5 mg via INTRAVENOUS

## 2014-02-17 MED ORDER — SCOPOLAMINE 1 MG/3DAYS TD PT72
MEDICATED_PATCH | TRANSDERMAL | Status: DC | PRN
  Administered 2014-02-17: 1 via TRANSDERMAL

## 2014-02-17 MED ORDER — KETOROLAC TROMETHAMINE 30 MG/ML IJ SOLN
30.0000 mg | Freq: Once | INTRAMUSCULAR | Status: AC
  Administered 2014-02-17: 30 mg via INTRAVENOUS

## 2014-02-17 MED ORDER — SODIUM CHLORIDE 0.9 % IV SOLN
INTRAVENOUS | Status: DC | PRN
  Administered 2014-02-17: 5 mL

## 2014-02-17 MED ORDER — KCL IN DEXTROSE-NACL 20-5-0.9 MEQ/L-%-% IV SOLN
INTRAVENOUS | Status: DC
  Administered 2014-02-17: 21:00:00 via INTRAVENOUS
  Filled 2014-02-17 (×3): qty 1000

## 2014-02-17 MED ORDER — DEXAMETHASONE SODIUM PHOSPHATE 4 MG/ML IJ SOLN
INTRAMUSCULAR | Status: DC | PRN
  Administered 2014-02-17: 8 mg via INTRAVENOUS

## 2014-02-17 MED ORDER — NEOSTIGMINE METHYLSULFATE 10 MG/10ML IV SOLN
INTRAVENOUS | Status: AC
  Filled 2014-02-17: qty 1

## 2014-02-17 MED ORDER — SODIUM CHLORIDE 0.9 % IR SOLN
Status: DC | PRN
  Administered 2014-02-17: 1000 mL

## 2014-02-17 MED ORDER — OXYCODONE-ACETAMINOPHEN 5-325 MG PO TABS
1.0000 | ORAL_TABLET | ORAL | Status: DC | PRN
  Administered 2014-02-17 – 2014-02-18 (×3): 2 via ORAL
  Filled 2014-02-17 (×3): qty 2

## 2014-02-17 MED ORDER — BUPIVACAINE-EPINEPHRINE 0.25% -1:200000 IJ SOLN
INTRAMUSCULAR | Status: DC | PRN
  Administered 2014-02-17: 20 mL

## 2014-02-17 MED ORDER — HYDROMORPHONE HCL 1 MG/ML IJ SOLN
INTRAMUSCULAR | Status: AC
  Administered 2014-02-17: 10:00:00
  Filled 2014-02-17: qty 1

## 2014-02-17 MED ORDER — CHLORHEXIDINE GLUCONATE 4 % EX LIQD: 1.0000 "application " | Freq: Once | CUTANEOUS | Status: DC

## 2014-02-17 MED ORDER — HYDROMORPHONE HCL 1 MG/ML IJ SOLN
0.2500 mg | INTRAMUSCULAR | Status: DC | PRN
  Administered 2014-02-17 (×5): 0.5 mg via INTRAVENOUS

## 2014-02-17 MED ORDER — LACTATED RINGERS IV SOLN
INTRAVENOUS | Status: DC | PRN
  Administered 2014-02-17: 07:00:00 via INTRAVENOUS

## 2014-02-17 MED ORDER — ONDANSETRON HCL 4 MG PO TABS: 4.0000 mg | ORAL_TABLET | Freq: Four times a day (QID) | ORAL | Status: DC | PRN

## 2014-02-17 MED ORDER — KETOROLAC TROMETHAMINE 30 MG/ML IJ SOLN
INTRAMUSCULAR | Status: AC
  Filled 2014-02-17: qty 1

## 2014-02-17 MED ORDER — TRAMADOL HCL 50 MG PO TABS: 50.0000 mg | ORAL_TABLET | Freq: Four times a day (QID) | ORAL | Status: DC | PRN

## 2014-02-17 MED ORDER — PHENYLEPHRINE HCL 10 MG/ML IJ SOLN
INTRAMUSCULAR | Status: DC | PRN
  Administered 2014-02-17: 40 ug via INTRAVENOUS

## 2014-02-17 MED ORDER — GLYCOPYRROLATE 0.2 MG/ML IJ SOLN
INTRAMUSCULAR | Status: DC | PRN
  Administered 2014-02-17: 0.6 mg via INTRAVENOUS

## 2014-02-17 MED ORDER — MIDAZOLAM HCL 2 MG/2ML IJ SOLN
INTRAMUSCULAR | Status: AC
  Filled 2014-02-17: qty 2

## 2014-02-17 MED ORDER — ONDANSETRON HCL 4 MG/2ML IJ SOLN
INTRAMUSCULAR | Status: AC
  Filled 2014-02-17: qty 2

## 2014-02-17 MED ORDER — ONDANSETRON HCL 4 MG/2ML IJ SOLN
INTRAMUSCULAR | Status: DC | PRN
  Administered 2014-02-17 (×2): 4 mg via INTRAVENOUS

## 2014-02-17 MED ORDER — ROCURONIUM BROMIDE 50 MG/5ML IV SOLN
INTRAVENOUS | Status: AC
  Filled 2014-02-17: qty 1

## 2014-02-17 MED ORDER — LIDOCAINE HCL (CARDIAC) 20 MG/ML IV SOLN
INTRAVENOUS | Status: AC
Start: 2014-02-17 — End: 2014-02-17
  Filled 2014-02-17: qty 5

## 2014-02-17 MED ORDER — ONDANSETRON HCL 4 MG/2ML IJ SOLN: 4.0000 mg | Freq: Four times a day (QID) | INTRAMUSCULAR | Status: DC | PRN

## 2014-02-17 MED ORDER — LIDOCAINE HCL (CARDIAC) 20 MG/ML IV SOLN
INTRAVENOUS | Status: DC | PRN
  Administered 2014-02-17: 100 mg via INTRATRACHEAL
  Administered 2014-02-17: 40 mg via INTRAVENOUS

## 2014-02-17 MED ORDER — SUCCINYLCHOLINE CHLORIDE 20 MG/ML IJ SOLN
INTRAMUSCULAR | Status: AC
Start: 2014-02-17 — End: 2014-02-17
  Filled 2014-02-17: qty 1

## 2014-02-17 MED ORDER — PROPOFOL 10 MG/ML IV BOLUS
INTRAVENOUS | Status: AC
  Filled 2014-02-17: qty 20

## 2014-02-17 MED ORDER — OXYCODONE HCL 5 MG/5ML PO SOLN: 5.0000 mg | Freq: Once | ORAL | Status: DC | PRN

## 2014-02-17 MED ORDER — ALUM & MAG HYDROXIDE-SIMETH 200-200-20 MG/5ML PO SUSP
30.0000 mL | Freq: Four times a day (QID) | ORAL | Status: DC | PRN
  Administered 2014-02-17: 30 mL via ORAL
  Filled 2014-02-17: qty 30

## 2014-02-17 MED ORDER — MORPHINE SULFATE 2 MG/ML IJ SOLN: 2.0000 mg | INTRAMUSCULAR | Status: DC | PRN

## 2014-02-17 MED ORDER — BUPIVACAINE-EPINEPHRINE (PF) 0.25% -1:200000 IJ SOLN
INTRAMUSCULAR | Status: AC
  Filled 2014-02-17: qty 30

## 2014-02-17 MED ORDER — HEPARIN SODIUM (PORCINE) 5000 UNIT/ML IJ SOLN
5000.0000 [IU] | Freq: Three times a day (TID) | INTRAMUSCULAR | Status: DC
  Administered 2014-02-18: 5000 [IU] via SUBCUTANEOUS
  Filled 2014-02-17 (×4): qty 1

## 2014-02-17 MED ORDER — FENTANYL CITRATE 0.05 MG/ML IJ SOLN
INTRAMUSCULAR | Status: DC | PRN
  Administered 2014-02-17: 100 ug via INTRAVENOUS
  Administered 2014-02-17: 50 ug via INTRAVENOUS

## 2014-02-17 MED ORDER — KETOROLAC TROMETHAMINE 30 MG/ML IJ SOLN
INTRAMUSCULAR | Status: AC
  Administered 2014-02-17: 10:00:00
  Filled 2014-02-17: qty 1

## 2014-02-17 MED ORDER — DEXAMETHASONE SODIUM PHOSPHATE 4 MG/ML IJ SOLN
INTRAMUSCULAR | Status: AC
  Filled 2014-02-17: qty 2

## 2014-02-17 MED ORDER — NEOSTIGMINE METHYLSULFATE 10 MG/10ML IV SOLN
INTRAVENOUS | Status: DC | PRN
  Administered 2014-02-17: 4 mg via INTRAVENOUS

## 2014-02-17 MED ORDER — OXYCODONE HCL 5 MG PO TABS: 5.0000 mg | ORAL_TABLET | Freq: Once | ORAL | Status: DC | PRN

## 2014-02-17 MED ORDER — ONDANSETRON HCL 4 MG/2ML IJ SOLN: 4.0000 mg | Freq: Once | INTRAMUSCULAR | Status: DC | PRN

## 2014-02-17 MED ORDER — ROCURONIUM BROMIDE 100 MG/10ML IV SOLN
INTRAVENOUS | Status: DC | PRN
  Administered 2014-02-17: 40 mg via INTRAVENOUS

## 2014-02-17 MED ORDER — DIPHENHYDRAMINE HCL 50 MG/ML IJ SOLN
INTRAMUSCULAR | Status: DC | PRN
  Administered 2014-02-17: 10 mg via INTRAVENOUS

## 2014-02-17 MED ORDER — SCOPOLAMINE 1 MG/3DAYS TD PT72
MEDICATED_PATCH | TRANSDERMAL | Status: AC
  Filled 2014-02-17: qty 1

## 2014-02-17 MED ORDER — GLYCOPYRROLATE 0.2 MG/ML IJ SOLN
INTRAMUSCULAR | Status: AC
  Filled 2014-02-17: qty 1

## 2014-02-17 MED ORDER — OXYCODONE-ACETAMINOPHEN 5-325 MG PO TABS: 1.0000 | ORAL_TABLET | Freq: Four times a day (QID) | ORAL | Status: DC | PRN

## 2014-02-17 MED ORDER — DIPHENHYDRAMINE HCL 50 MG/ML IJ SOLN
INTRAMUSCULAR | Status: AC
  Filled 2014-02-17: qty 1

## 2014-02-17 MED ORDER — SODIUM CHLORIDE 0.9 % IJ SOLN
INTRAMUSCULAR | Status: AC
  Filled 2014-02-17: qty 10

## 2014-02-17 MED ORDER — EPHEDRINE SULFATE 50 MG/ML IJ SOLN
INTRAMUSCULAR | Status: AC
Start: 2014-02-17 — End: 2014-02-17
  Filled 2014-02-17: qty 1

## 2014-02-17 SURGICAL SUPPLY — 40 items
APPLIER CLIP 5 13 M/L LIGAMAX5 (MISCELLANEOUS) ×2
APR CLP MED LRG 5 ANG JAW (MISCELLANEOUS) ×1
BAG SPEC RTRVL LRG 6X4 10 (ENDOMECHANICALS) ×1
BLADE SURG ROTATE 9660 (MISCELLANEOUS) IMPLANT
CANISTER SUCTION 2500CC (MISCELLANEOUS) ×2 IMPLANT
CATH REDDICK CHOLANGI 4FR 50CM (CATHETERS) ×2 IMPLANT
CHLORAPREP W/TINT 26ML (MISCELLANEOUS) ×2 IMPLANT
CLIP APPLIE 5 13 M/L LIGAMAX5 (MISCELLANEOUS) ×1 IMPLANT
COVER MAYO STAND STRL (DRAPES) ×2 IMPLANT
COVER SURGICAL LIGHT HANDLE (MISCELLANEOUS) ×2 IMPLANT
DRAPE C-ARM 42X72 X-RAY (DRAPES) ×2 IMPLANT
DRAPE LAPAROSCOPIC ABDOMINAL (DRAPES) ×2 IMPLANT
ELECT REM PT RETURN 9FT ADLT (ELECTROSURGICAL) ×2
ELECTRODE REM PT RTRN 9FT ADLT (ELECTROSURGICAL) ×1 IMPLANT
GLOVE BIO SURGEON STRL SZ7 (GLOVE) ×1 IMPLANT
GLOVE BIO SURGEON STRL SZ7.5 (GLOVE) ×3 IMPLANT
GLOVE BIOGEL PI IND STRL 7.0 (GLOVE) IMPLANT
GLOVE BIOGEL PI IND STRL 7.5 (GLOVE) IMPLANT
GLOVE BIOGEL PI INDICATOR 7.0 (GLOVE) ×3
GLOVE BIOGEL PI INDICATOR 7.5 (GLOVE) ×1
GOWN STRL REUS W/ TWL LRG LVL3 (GOWN DISPOSABLE) ×3 IMPLANT
GOWN STRL REUS W/TWL LRG LVL3 (GOWN DISPOSABLE) ×8
IV CATH 14GX2 1/4 (CATHETERS) ×2 IMPLANT
KIT BASIN OR (CUSTOM PROCEDURE TRAY) ×2 IMPLANT
KIT ROOM TURNOVER OR (KITS) ×2 IMPLANT
LIQUID BAND (GAUZE/BANDAGES/DRESSINGS) ×2 IMPLANT
NS IRRIG 1000ML POUR BTL (IV SOLUTION) ×2 IMPLANT
PAD ARMBOARD 7.5X6 YLW CONV (MISCELLANEOUS) ×2 IMPLANT
POUCH SPECIMEN RETRIEVAL 10MM (ENDOMECHANICALS) ×2 IMPLANT
SCISSORS LAP 5X35 DISP (ENDOMECHANICALS) ×2 IMPLANT
SET IRRIG TUBING LAPAROSCOPIC (IRRIGATION / IRRIGATOR) ×2 IMPLANT
SLEEVE ENDOPATH XCEL 5M (ENDOMECHANICALS) ×4 IMPLANT
SPECIMEN JAR SMALL (MISCELLANEOUS) ×2 IMPLANT
SUT MNCRL AB 4-0 PS2 18 (SUTURE) ×2 IMPLANT
TOWEL OR 17X24 6PK STRL BLUE (TOWEL DISPOSABLE) ×2 IMPLANT
TOWEL OR 17X26 10 PK STRL BLUE (TOWEL DISPOSABLE) ×2 IMPLANT
TRAY LAPAROSCOPIC (CUSTOM PROCEDURE TRAY) ×2 IMPLANT
TROCAR XCEL BLUNT TIP 100MML (ENDOMECHANICALS) ×2 IMPLANT
TROCAR XCEL NON-BLD 5MMX100MML (ENDOMECHANICALS) ×2 IMPLANT
TUBING INSUFFLATION (TUBING) ×2 IMPLANT

## 2014-02-17 NOTE — Anesthesia Procedure Notes (Signed)
Procedure Name: Intubation Date/Time: 02/17/2014 7:37 AM Performed by: Jenne Campus Pre-anesthesia Checklist: Patient identified, Emergency Drugs available, Suction available, Patient being monitored and Timeout performed Patient Re-evaluated:Patient Re-evaluated prior to inductionOxygen Delivery Method: Circle system utilized Preoxygenation: Pre-oxygenation with 100% oxygen Intubation Type: IV induction Ventilation: Mask ventilation without difficulty and Oral airway inserted - appropriate to patient size Laryngoscope Size: Miller and 2 Grade View: Grade II Tube type: Oral Tube size: 7.0 mm Number of attempts: 1 Airway Equipment and Method: Stylet and LTA kit utilized Placement Confirmation: ETT inserted through vocal cords under direct vision,  positive ETCO2,  CO2 detector and breath sounds checked- equal and bilateral Secured at: 20 cm Tube secured with: Tape Dental Injury: Teeth and Oropharynx as per pre-operative assessment

## 2014-02-17 NOTE — H&P (Signed)
Dana Bray 01/17/2014 9:06 AM Location: [facility] Patient #: [id number] DOB: [birth year] Married / Language: English / Race: White Female  History of Present Illness Dana Bray(Dana Gopal S. Dana Edouardoth MD; 01/17/2014 10:59 AM) Patient words: eval GB possible gallstones.  The patient is a 44 year old female who presents with abdominal pain. We are asked to see the patient in consultation by Dr. Marena ChancyWilliam Bray to evaluate her for gallstones. The patient is a 44 year old white female who developed severe right upper quadrant pain on Saturday night. The pain was associated with nausea and vomiting. The pain was severe enough that it took her to the emergency department. She underwent an ultrasound which did show stones in her gallbladder but no gallbladder wall thickening or ductal dilatation. Her liver functions were normal. Her pain eventually resolved and she went home. Her only other complaint is of pain in her back that radiates down her legs. She states she has known herniated disks. She has not seen a Careers advisersurgeon for this yet.   Other Problems Dana Bray(Dana Bray, CMA; 01/17/2014 9:06 AM) Back Pain Bladder Problems Cholelithiasis Hemorrhoids  Past Surgical History Dana Bray(Dana Bray, CMA; 01/17/2014 9:06 AM) Cesarean Section - Multiple Tonsillectomy  Diagnostic Studies History Dana Bray(Dana Bray, CMA; 01/17/2014 9:06 AM) Colonoscopy never Mammogram within last year Pap Smear 1-5 years ago  Allergies Dana Bray(Dana Bray, CMA; 01/17/2014 9:11 AM) Nuts Cat Dander Dog Dander  Medication History Dana Bray(Dana Bray, CMA; 01/17/2014 9:13 AM) TraMADol HCl (50MG  Tablet Disperse, Oral) Active.  Social History Dana Bray(Dana Bray, CMA; 01/17/2014 9:06 AM) Alcohol use Occasional alcohol use. Caffeine use Coffee. No drug use Tobacco use Never smoker.  Family History Dana Bray(Dana Bray, CMA; 01/17/2014 9:06 AM) Hypertension Mother.  Pregnancy / Birth History Dana Bray(Dana Bray,  CMA; 01/17/2014 9:06 AM) Age at menarche 13 years. Contraceptive History Oral contraceptives. Gravida 5 Irregular periods Maternal age 43-35 Para 3  Review of Systems Dana Bray(Dana Bray CMA; 01/17/2014 9:06 AM) General Present- Fatigue. Not Present- Appetite Loss, Chills, Fever, Night Sweats, Weight Gain and Weight Loss. Skin Not Present- Change in Wart/Mole, Dryness, Hives, Jaundice, New Lesions, Non-Healing Wounds, Rash and Ulcer. HEENT Not Present- Earache, Hearing Loss, Hoarseness, Nose Bleed, Oral Ulcers, Ringing in the Ears, Seasonal Allergies, Sinus Pain, Sore Throat, Visual Disturbances, Wears glasses/contact lenses and Yellow Eyes. Respiratory Not Present- Bloody sputum, Chronic Cough, Difficulty Breathing, Snoring and Wheezing. Breast Not Present- Breast Mass, Breast Pain, Nipple Discharge and Skin Changes. Cardiovascular Not Present- Chest Pain, Difficulty Breathing Lying Down, Leg Cramps, Palpitations, Rapid Heart Rate, Shortness of Breath and Swelling of Extremities. Gastrointestinal Present- Bloating, Constipation, Hemorrhoids and Nausea. Not Present- Abdominal Pain, Bloody Stool, Change in Bowel Habits, Chronic diarrhea, Difficulty Swallowing, Excessive gas, Gets full quickly at meals, Indigestion, Rectal Pain and Vomiting. Female Genitourinary Not Present- Frequency, Nocturia, Painful Urination, Pelvic Pain and Urgency. Musculoskeletal Present- Back Pain and Muscle Pain. Not Present- Joint Pain, Joint Stiffness, Muscle Weakness and Swelling of Extremities. Neurological Not Present- Decreased Memory, Fainting, Headaches, Numbness, Seizures, Tingling, Tremor, Trouble walking and Weakness. Psychiatric Not Present- Anxiety, Bipolar, Change in Sleep Pattern, Depression, Fearful and Frequent crying. Endocrine Not Present- Cold Intolerance, Excessive Hunger, Hair Changes, Heat Intolerance, Hot flashes and New Diabetes. Hematology Not Present- Easy Bruising, Excessive bleeding, Gland  problems, HIV and Persistent Infections.   Vitals Dana Bray(Dana Bray CMA; 01/17/2014 9:15 AM) 01/17/2014 9:14 AM Weight: 168 lb Height: 65in Body Surface Area: 1.87 m Body Mass Index: 27.96 kg/m Temp.: 98.32F  Pulse: 79 (Regular)  BP: 138/90 (Sitting, Left Arm, Standard)  Physical Exam Renae Fickle S. Dana Edouard MD; 01/17/2014 11:02 AM) General Mental Status-Alert. General Appearance-Consistent with stated age. Hydration-Well hydrated. Voice-Normal.  Head and Neck Head-normocephalic, atraumatic with no lesions or palpable masses. Trachea-midline. Thyroid Gland Characteristics - normal size and consistency.  Eye Eyeball - Bilateral-Extraocular movements intact. Sclera/Conjunctiva - Bilateral-No scleral icterus.  Chest and Lung Exam Chest and lung exam reveals -quiet, even and easy respiratory effort with no use of accessory muscles and on auscultation, normal breath sounds, no adventitious sounds and normal vocal resonance. Inspection Chest Wall - Normal. Back - normal.  Cardiovascular Cardiovascular examination reveals -normal heart sounds, regular rate and rhythm with no murmurs and normal pedal pulses bilaterally.  Abdomen Note: The abdomen is soft. There is moderate tenderness in the right upper quadrant. There is no palpable mass. There is no guarding.   Neurologic Neurologic evaluation reveals -alert and oriented x 3 with no impairment of recent or remote memory. Mental Status-Normal.  Musculoskeletal Normal Exam - Left-Upper Extremity Strength Normal and Lower Extremity Strength Normal. Normal Exam - Right-Upper Extremity Strength Normal and Lower Extremity Strength Normal.  Lymphatic Head & Neck  General Head & Neck Lymphatics: Bilateral - Description - Normal. Axillary  General Axillary Region: Bilateral - Description - Normal. Tenderness - Non Tender. Femoral & Inguinal  Generalized Femoral & Inguinal Lymphatics:  Bilateral - Description - Normal. Tenderness - Non Tender.    Assessment & Plan Renae Fickle S. Dana Edouard MD; 01/17/2014 11:03 AM) GALLSTONES (574.20  K80.20) Impression: The patient appears to have symptomatic gallstones. Because the risk of further painful episodes and possible pancreatitis setting she would benefit from having her gallbladder removed. She would also like to have this done. I discussed with her in detail the risks and benefits of the operation to remove the gallbladder as well as some of the technical aspects and she understands and wishes to proceed. I will also plan to refer her to a neurosurgeon named Dr. Marikay Alar to evaluate her back     Signed by Caleen Essex, MD (01/17/2014 11:04 AM)

## 2014-02-17 NOTE — Op Note (Signed)
02/17/2014  8:43 AM  PATIENT:  Dana Bray  44 y.o. female  PRE-OPERATIVE DIAGNOSIS:  Cholelithiasis  POST-OPERATIVE DIAGNOSIS:  Cholelithiasis with cholecystitis  PROCEDURE:  Procedure(s): LAPAROSCOPIC CHOLECYSTECTOMY WITH INTRAOPERATIVE CHOLANGIOGRAM (N/A)  SURGEON:  Surgeon(s) and Role:    * Griselda MinerPaul Toth III, MD - Primary  PHYSICIAN ASSISTANT:   ASSISTANTS: Magnus IvanSheila Bowman, RNFA   ANESTHESIA:   general  EBL:     BLOOD ADMINISTERED:none  DRAINS: none   LOCAL MEDICATIONS USED:  MARCAINE     SPECIMEN:  Source of Specimen:  gallbladder  DISPOSITION OF SPECIMEN:  PATHOLOGY  COUNTS:  YES  TOURNIQUET:  * No tourniquets in log *  DICTATION: .Dragon Dictation   Procedure: After informed consent was obtained the patient was brought to the operating room and placed in the supine position on the operating room table. After adequate induction of general anesthesia the patient's abdomen was prepped with ChloraPrep allowed to dry and draped in usual sterile manner. The area below the umbilicus was infiltrated with quarter percent  Marcaine. A small incision was made with a 15 blade knife. The incision was carried down through the subcutaneous tissue bluntly with a hemostat and [employer] retractors. The linea alba was identified. The linea alba was incised with a 15 blade knife and each side was grasped with Coker clamps. The preperitoneal space was then probed with a hemostat until the peritoneum was opened and access was gained to the abdominal cavity. A 0 Vicryl pursestring stitch was placed in the fascia surrounding the opening. A Hassan cannula was then placed through the opening and anchored in place with the previously placed Vicryl purse string stitch. The abdomen was insufflated with carbon dioxide without difficulty. A laparoscope was inserted through the [facility] cannula in the right upper quadrant was inspected. Next the epigastric region was infiltrated with % Marcaine. A  small incision was made with a 15 blade knife. A 5 mm port was placed bluntly through this incision into the abdominal cavity under direct vision. Next 2 sites were chosen laterally on the right side of the abdomen for placement of 5 mm ports. Each of these areas was infiltrated with quarter percent Marcaine. Small stab incisions were made with a 15 blade knife. 5 mm ports were then placed bluntly through these incisions into the abdominal cavity under direct vision without difficulty. A blunt grasper was placed through the lateralmost 5 mm port and used to grasp the dome of the gallbladder and elevated anteriorly and superiorly. Another blunt grasper was placed through the other 5 mm port and used to retract the body and neck of the gallbladder. A dissector was placed through the epigastric port and using the electrocautery the peritoneal reflection at the gallbladder neck was opened. Blunt dissection was then carried out in this area until the gallbladder neck-cystic duct junction was readily identified and a good window was created. A single clip was placed on the gallbladder neck. A small  ductotomy was made just below the clip with laparoscopic scissors. A 14-gauge Angiocath was then placed through the anterior abdominal wall under direct vision. A Reddick cholangiogram catheter was then placed through the Angiocath and flushed. The catheter was then placed in the cystic duct and anchored in place with a clip. A cholangiogram was obtained that showed no filling defects good emptying into the duodenum an adequate length on the cystic duct. The anchoring clip and catheters were then removed from the patient. 3 clips were placed proximally on the cystic  duct and the duct was divided between the 2 sets of clips. Posterior to this the cystic artery was identified and again dissected bluntly in a circumferential manner until a good window  was created. 2 clips were placed proximally and one distally on the artery and  the artery was divided between the 2 sets of clips. Next a laparoscopic hook cautery device was used to separate the gallbladder from the liver bed. Prior to completely detaching the gallbladder from the liver bed the liver bed was inspected and several small bleeding points were coagulated with the electrocautery until the area was completely hemostatic. The gallbladder was then detached the rest of it from the liver bed without difficulty. A laparoscopic bag was inserted through the hassan canula. The gallbladder was placed within the bag and the bag was sealed. A laparoscope was then moved to the epigastric port.  The bag with the gallbladder was then removed with the [facility] cannula through the infraumbilical port without difficulty. The fascial defect was then closed with the previously placed Vicryl pursestring stitch as well as with another figure-of-eight 0 Vicryl stitch. The liver bed was inspected again and found to be hemostatic. The abdomen was irrigated with copious amounts of saline until the effluent was clear. The ports were then removed under direct vision without difficulty and were found to be hemostatic. The gas was allowed to escape. The skin incisions were all closed with interrupted 4-0 Monocryl subcuticular stitches. Dermabond dressings were applied. The patient tolerated the procedure well. At the end of the case all needle sponge and instrument counts were correct. The patient was then awakened and taken to recovery in stable condition  PLAN OF CARE: Admit for overnight observation  PATIENT DISPOSITION:  PACU - hemodynamically stable.   Delay start of Pharmacological VTE agent (>24hrs) due to surgical blood loss or risk of bleeding: yes

## 2014-02-17 NOTE — Anesthesia Postprocedure Evaluation (Signed)
  Anesthesia Post-op Note  Patient: Dana Bray  Procedure(s) Performed: Procedure(s): LAPAROSCOPIC CHOLECYSTECTOMY WITH INTRAOPERATIVE CHOLANGIOGRAM (N/A)  Patient Location: PACU  Anesthesia Type:General  Level of Consciousness: awake, alert  and oriented  Airway and Oxygen Therapy: Patient Spontanous Breathing and Patient connected to nasal cannula oxygen  Post-op Pain: mild  Post-op Assessment: Post-op Vital signs reviewed, Patient's Cardiovascular Status Stable, Respiratory Function Stable, Patent Airway, No signs of Nausea or vomiting and Pain level controlled  Post-op Vital Signs: stable  Last Vitals:  Filed Vitals:   02/17/14 1340  BP: 108/60  Pulse: 64  Temp: 36.7 C  Resp: 16    Complications: No apparent anesthesia complications

## 2014-02-17 NOTE — Interval H&P Note (Signed)
History and Physical Interval Note:  02/17/2014 7:03 AM  Dana Bray  has presented today for surgery, with the diagnosis of Gallstones  The various methods of treatment have been discussed with the patient and family. After consideration of risks, benefits and other options for treatment, the patient has consented to  Procedure(s): LAPAROSCOPIC CHOLECYSTECTOMY WITH INTRAOPERATIVE CHOLANGIOGRAM (N/A) as a surgical intervention .  The patient's history has been reviewed, patient examined, no change in status, stable for surgery.  I have reviewed the patient's chart and labs.  Questions were answered to the patient's satisfaction.     TOTH III,PAUL S

## 2014-02-17 NOTE — Anesthesia Preprocedure Evaluation (Addendum)
Anesthesia Evaluation  Patient identified by MRN, date of birth, ID band Patient awake    Reviewed: Allergy & Precautions, NPO status , Patient's Chart, lab work & pertinent test results  History of Anesthesia Complications Negative for: history of anesthetic complications  Airway Mallampati: II  TM Distance: >3 FB Neck ROM: Full    Dental  (+) Teeth Intact, Dental Advisory Given   Pulmonary neg pulmonary ROS,  breath sounds clear to auscultation        Cardiovascular negative cardio ROS  Rhythm:Regular Rate:Normal     Neuro/Psych negative neurological ROS     GI/Hepatic negative GI ROS, Neg liver ROS,   Endo/Other  negative endocrine ROS  Renal/GU negative Renal ROS     Musculoskeletal   Abdominal   Peds  Hematology negative hematology ROS (+)   Anesthesia Other Findings   Reproductive/Obstetrics negative OB ROS                            Anesthesia Physical Anesthesia Plan  ASA: II  Anesthesia Plan: General   Post-op Pain Management:    Induction: Intravenous  Airway Management Planned: Oral ETT  Additional Equipment:   Intra-op Plan:   Post-operative Plan:   Informed Consent: I have reviewed the patients History and Physical, chart, labs and discussed the procedure including the risks, benefits and alternatives for the proposed anesthesia with the patient or authorized representative who has indicated his/her understanding and acceptance.   Dental advisory given  Plan Discussed with: CRNA and Anesthesiologist  Anesthesia Plan Comments:         Anesthesia Quick Evaluation

## 2014-02-17 NOTE — Transfer of Care (Signed)
Immediate Anesthesia Transfer of Care Note  Patient: Dana Bray  Procedure(s) Performed: Procedure(s): LAPAROSCOPIC CHOLECYSTECTOMY WITH INTRAOPERATIVE CHOLANGIOGRAM (N/A)  Patient Location: PACU  Anesthesia Type:General  Level of Consciousness: awake, alert , oriented and patient cooperative  Airway & Oxygen Therapy: Patient Spontanous Breathing and Patient connected to nasal cannula oxygen  Post-op Assessment: Report given to PACU RN and Post -op Vital signs reviewed and stable  Post vital signs: Reviewed  Complications: No apparent anesthesia complications

## 2014-02-17 NOTE — Progress Notes (Signed)
Pt arrived to 6N18 at current time.  Pt A&O x 4, c/o 3/10 ABD surgical pain, 4 surgical incisions closed with Dermabond, no drains.   Pt V/S taken,  fluids running at 50 cc/hr.  Pt DTV by 1500.  Pt without distress.  Family alerted. Diet ordered, will monitor.

## 2014-02-18 DIAGNOSIS — K8010 Calculus of gallbladder with chronic cholecystitis without obstruction: Secondary | ICD-10-CM | POA: Diagnosis not present

## 2014-02-18 MED ORDER — OXYCODONE HCL 5 MG PO TABS: 5.0000 mg | ORAL_TABLET | ORAL | Status: DC | PRN

## 2014-02-18 NOTE — Discharge Summary (Signed)
Physician Discharge Summary [facility]  Patient ID: Dana Bray MRN: [id number] DOB/AGE: 43-21-1972 43 y.o.  Admit date: 02/17/2014 Discharge date: 02/18/2014  Admission Diagnoses:  Chronic cholecystitis, cholelithiasis  Discharge Diagnoses:  Active Problems:   Gallstones   Discharged Condition: good  Hospital Course: Patient was admitted for observation following gallbladder surgery.  Post op course was uncomplicated.  Pain was well controlled.  Tolerated diet.  Patient was prepared for discharge home on POD#1.  Consults: None  Treatments: surgery: lap chole with IOC  Discharge Exam: Blood pressure 107/66, pulse 58, temperature 98.1 F (36.7 C), temperature source Oral, resp. rate 16, height  (1.651 m), weight 176 lb 9.4 oz (80.1 kg), SpO2 99 %. HEENT - clear Neck - soft Chest - clear bilaterally Cor - RRR Abd - soft without distension; wounds dry and intact with Dermabond; RUQ soft and non-tender  Disposition: Home  Discharge Instructions    Call MD for:  difficulty breathing, headache or visual disturbances    Complete by:  As directed      Call MD for:  extreme fatigue    Complete by:  As directed      Call MD for:  hives    Complete by:  As directed      Call MD for:  persistant dizziness or light-headedness    Complete by:  As directed      Call MD for:  persistant nausea and vomiting    Complete by:  As directed      Call MD for:  redness, tenderness, or signs of infection (pain, swelling, redness, odor or green/yellow discharge around incision site)    Complete by:  As directed      Call MD for:  severe uncontrolled pain    Complete by:  As directed      Call MD for:  temperature >100.4    Complete by:  As directed      Diet - low sodium heart healthy    Complete by:  As directed      Diet - low sodium heart healthy    Complete by:  As directed      Discharge instructions    Complete by:  As directed   May shower. No  heavy lifting. Low fat diet     Discharge instructions    Complete by:  As directed   [facility]  LAPAROSCOPIC SURGERY - POST-OP INSTRUCTIONS  Always review your discharge instruction sheet given to you by the facility where your surgery was performed.  A prescription for pain medication may be given to you upon discharge.  Take your pain medication as prescribed.  If narcotic pain medicine is not needed, then you may take acetaminophen (Tylenol) or ibuprofen (Advil) as needed.  Take your usually prescribed medications unless otherwise directed.  If you need a refill on your pain medication, please contact your pharmacy.  They will contact our office to request authorization. Prescriptions will not be filled after 5 P.M. or on weekends.  You should follow a light diet the first few days after arrival home, such as soup and crackers or toast.  Be sure to include plenty of fluids daily.  Most patients will experience some swelling and bruising in the area of the incisions.  Ice packs will help.  Swelling and bruising can take several days to resolve.   It is common to experience some constipation if taking pain medication after surgery.  Increasing fluid  intake and taking a stool softener (such as Colace) will usually help or prevent this problem from occurring.  A mild laxative (Milk of Magnesia or Miralax) should be taken according to package instructions if there are no bowel movements after 48 hours.  Unless discharge instructions indicate otherwise, you may remove your bandages 24-48 hours after surgery, and you may shower at that time.  You may have steri-strips (small skin tapes) in place directly over the incision.  These strips should be left on the skin for 7-10 days.  If your surgeon used skin glue on the incision, you may shower in 24 hours.  The glue will flake off over the next 2-3 weeks.  Any sutures or staples will be removed at the office during your follow-up  visit.  ACTIVITIES:  You may resume regular (light) daily activities beginning the next day-such as daily self-care, walking, climbing stairs-gradually increasing activities as tolerated.  You may have sexual intercourse when it is comfortable.  Refrain from any heavy lifting or straining until approved by your doctor.  You may drive when you are no longer taking prescription pain medication, you can comfortably wear a seatbelt, and you can safely maneuver your car and apply brakes.  You should see your doctor in the office for a follow-up appointment approximately 2-3 weeks after your surgery.  Make sure that you call for this appointment within a day or two after you arrive home to insure a convenient appointment time.  WHEN TO CALL YOUR DOCTOR: Fever over 101.0 Inability to urinate Continued bleeding from incision Increased pain, redness, or drainage from the incision Increasing abdominal pain  The clinic staff is available to answer your questions during regular business hours.  Please don't hesitate to call and ask to speak to one of the nurses for clinical concerns.  If you have a medical emergency, go to the nearest emergency room or call 911.  A surgeon from [facility] is always on call for the hospital.  Velora Heckler, MD, [facility] Office: [phone] Toll Free:  [phone] FAX [phone]  Web site: [email or web address]     Increase activity slowly    Complete by:  As directed      Increase activity slowly    Complete by:  As directed      No dressing needed    Complete by:  As directed      No wound care    Complete by:  As directed             Medication List    TAKE these medications        bismuth subsalicylate 262 MG/15ML suspension  Commonly known as:  PEPTO BISMOL  Take 30 mLs by mouth every 6 (six) hours as needed for indigestion.     ondansetron 4 MG disintegrating tablet  Commonly known as:   ZOFRAN ODT  1 tab sublingual q6h PRN nausea/vomiting     oxyCODONE 5 MG immediate release tablet  Commonly known as:  Oxy IR/ROXICODONE  Take 1-2 tablets (5-10 mg total) by mouth every 4 (four) hours as needed for moderate pain.     oxyCODONE-acetaminophen 5-325 MG per tablet  Commonly known as:  PERCOCET  Take 1 tablet by mouth every 6 (six) hours as needed for moderate pain.     oxyCODONE-acetaminophen 5-325 MG per tablet  Commonly known as:  ROXICET  Take 1-2 tablets by mouth every 4 (four) hours as needed.  traMADol 50 MG tablet  Commonly known as:  ULTRAM  Take 50 mg by mouth every 6 (six) hours as needed for moderate pain.           Follow-up Information    Follow up with Robyne AskewTH III,PAUL S, MD In 2 weeks.   Specialty:  General Surgery   Contact information:   [address] [place] [address] [phone]       Follow up with Robyne AskewTH III,PAUL S, MD. Schedule an appointment as soon as possible for a visit in 2 weeks.   Specialty:  General Surgery   Why:  For wound re-check   Contact information:   [address] [place] [address] [phone]       Velora Hecklerodd M. Tavi Gaughran, MD, [facility] Office: [phone]   Signed: Velora HecklerGERKIN,Jayvian Escoe M 02/18/2014, 9:19 AM

## 2014-02-18 NOTE — Progress Notes (Signed)
Discharge instructions and Rx reviewed with pt and pt's spouse at the bedside. Appropriate questions were asked and answered. Pt reports feeling ready to discharge.

## 2014-02-18 NOTE — Discharge Instructions (Signed)
Cholecystitis ° Cholecystitis is swelling and irritation (inflammation) of your gallbladder. This often happens when gallstones or sludge build up in the gallbladder. Treatment is needed right away. °HOME CARE °Home care depends on how you were treated. In general: °· If you were given antibiotic medicine, take it as told. Finish the medicine even if you start to feel better. °· Only take medicines as told by your doctor. °· Eat low-fat foods until your next doctor visit. °· Keep all doctor visits as told. °GET HELP RIGHT AWAY IF: °· You have more pain and medicine does not help. °· Your pain moves to a different part of your belly (abdomen) or to your back. °· You have a fever. °· You feel sick to your stomach (nauseous). °· You throw up (vomit). °MAKE SURE YOU: °· Understand these instructions. °· Will watch your condition. °· Will get help right away if you are not doing well or get worse. °Document Released: 01/02/2011 Document Revised: 04/07/2011 Document Reviewed: 01/02/2011 °ExitCare® Patient Information ©2015 [employer]. This information is not intended to replace advice given to you by your health care provider. Make sure you discuss any questions you have with your health care provider. ° °

## 2014-02-20 ENCOUNTER — Encounter (HOSPITAL_COMMUNITY): Payer: Self-pay | Admitting: General Surgery

## 2016-02-05 IMAGING — RF DG CHOLANGIOGRAM OPERATIVE
1 series · 4 of 4 positions shown · non-contrast
Comparison: Ultrasound 06/22/2009

CLINICAL DATA: laparoscopic cholecystectomy, cholelithiasis

EXAM:
INTRAOPERATIVE CHOLANGIOGRAM
TECHNIQUE: Cholangiographic images from the C-arm fluoroscopic device were
submitted for interpretation post-operatively. Please see the
procedural report for the amount of contrast and the fluoroscopy
time utilized.

[Series 1: run · 4 of 52 frames shown]
[frame 8/52]
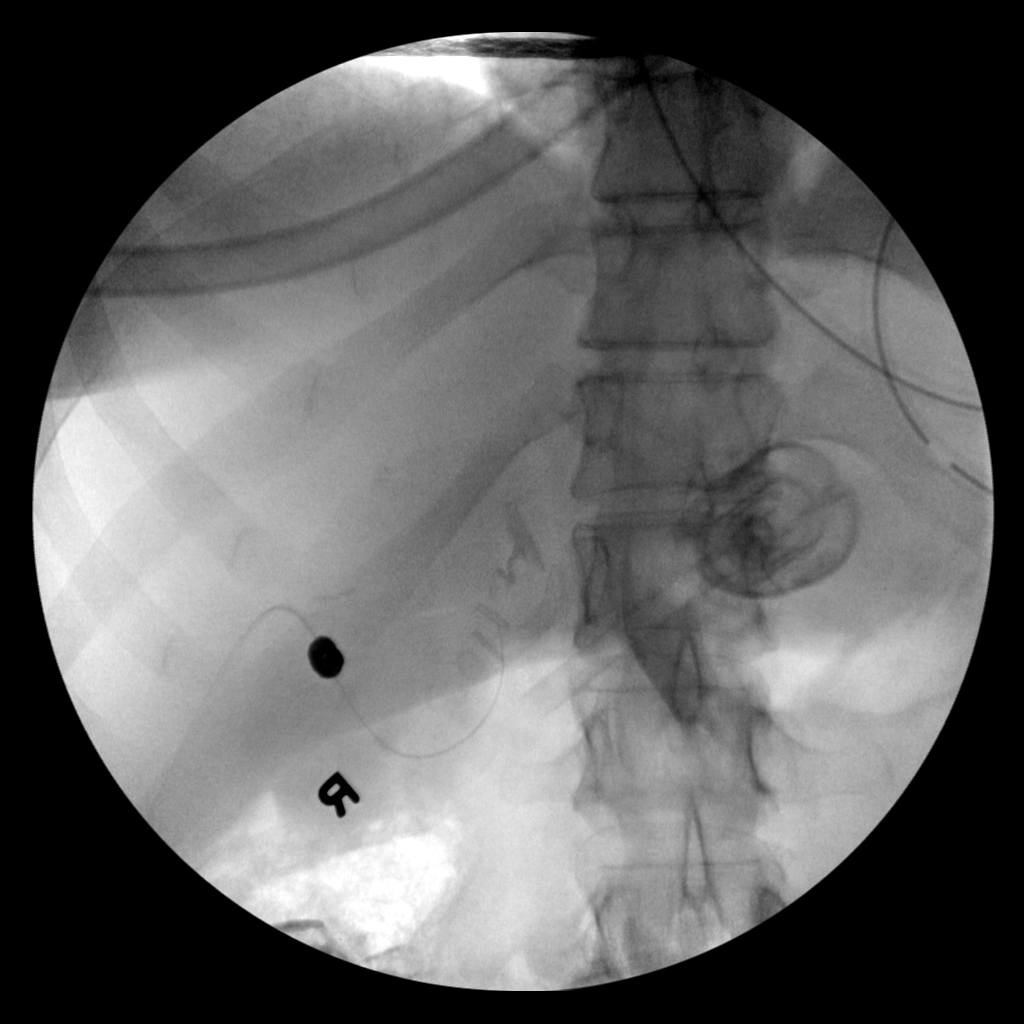
[frame 22/52]
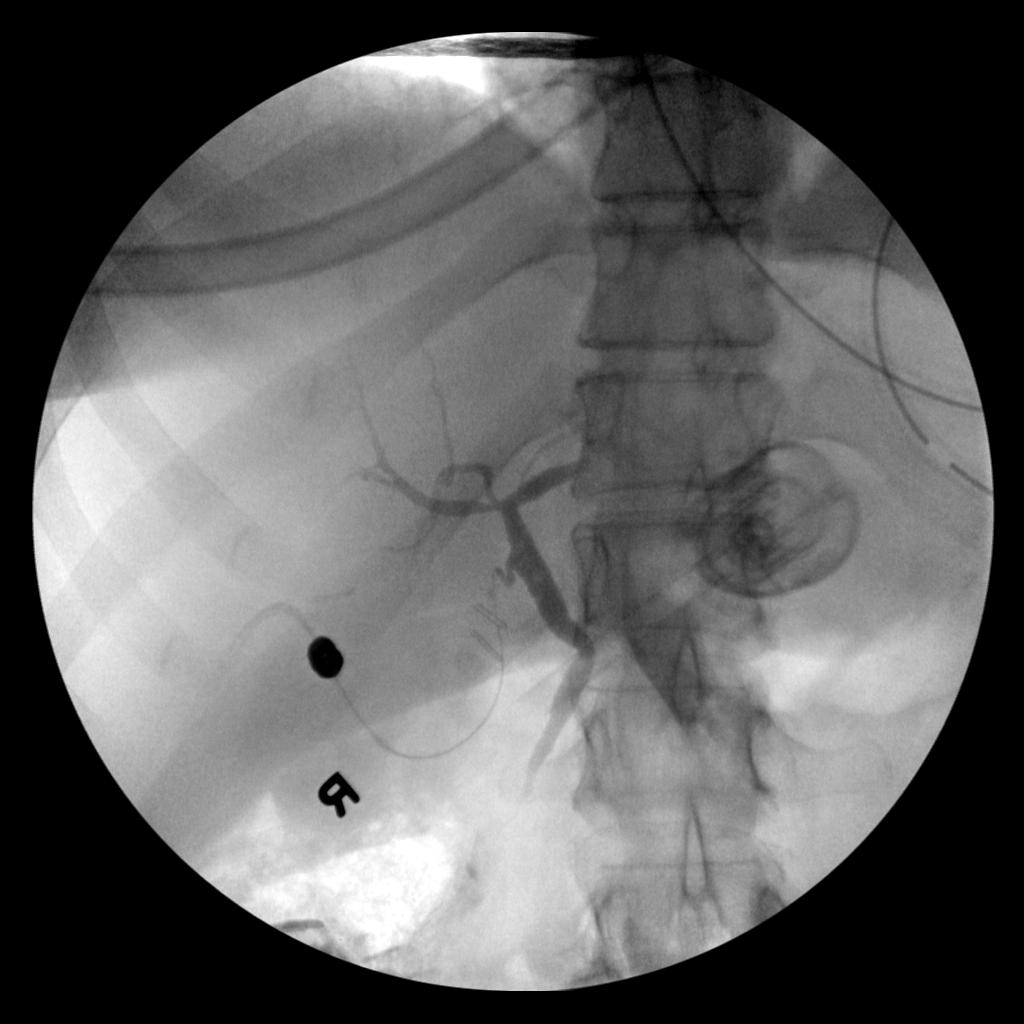
[frame 27/52]
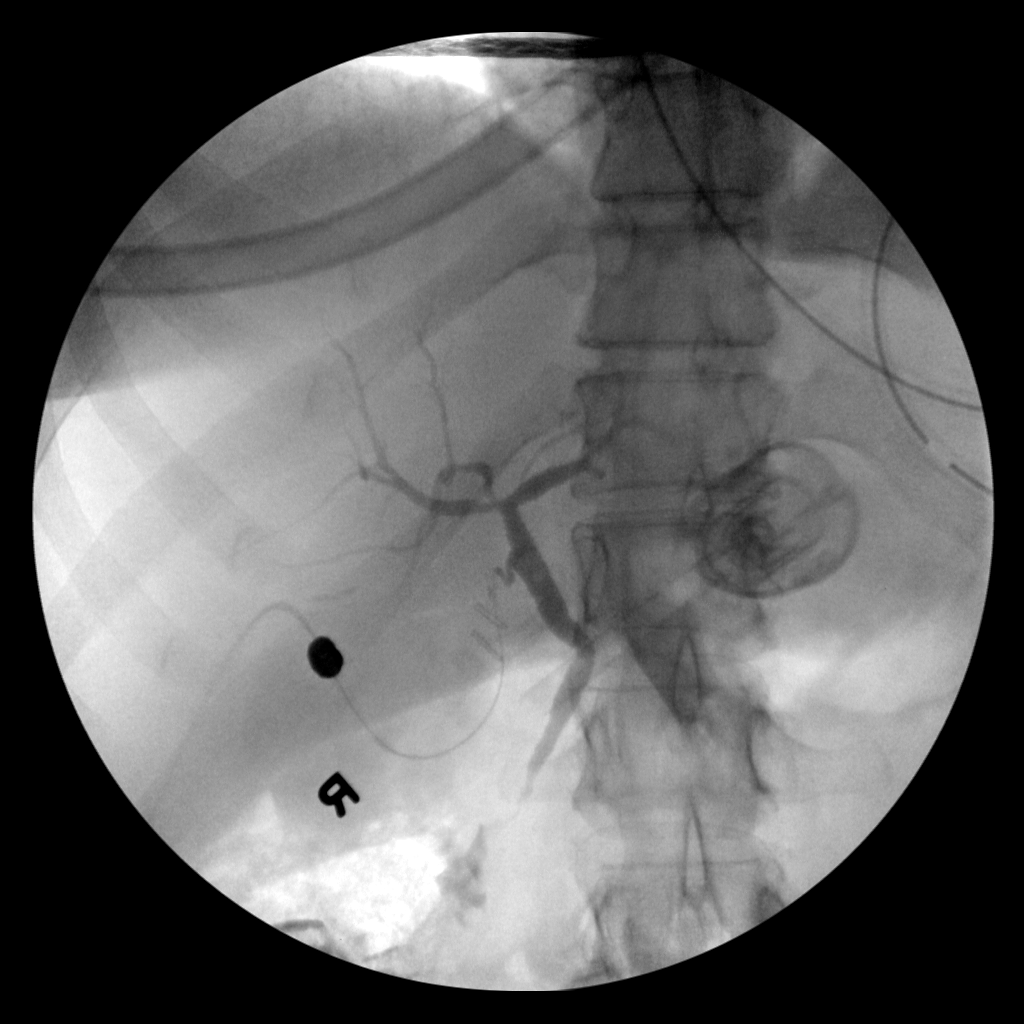
[frame 45/52]
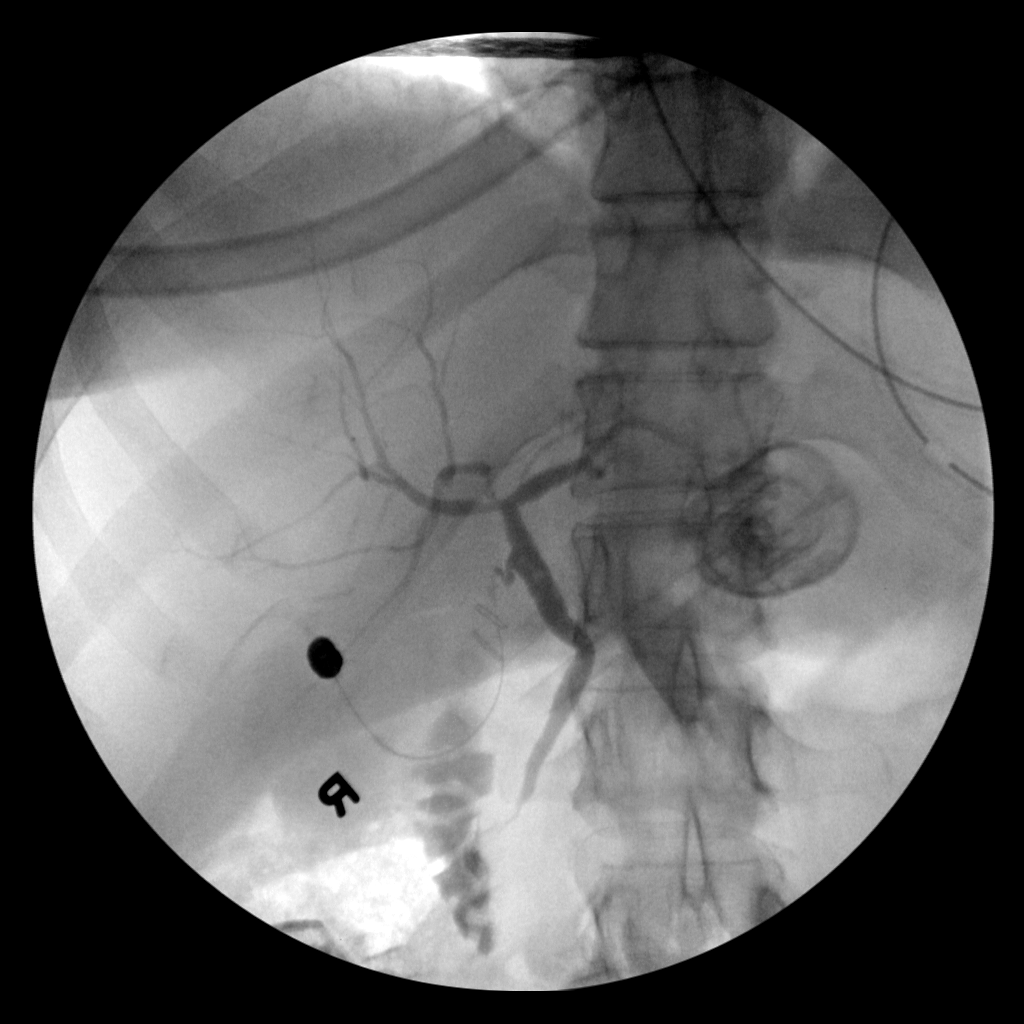

[4 of 4 positions shown; findings below may reference images not displayed]

FINDINGS: Two Small mobile filling defects in the common duct near the cystic
duct confluence. No obstruction. Intrahepatic ducts are incompletely
visualized, appearing decompressed centrally. Contrast passes into
the duodenum.

:
1. Nonocclusive filling defects in the mid common duct may represent
small air bubbles, clot, or stones.

## 2021-09-27 ENCOUNTER — Ambulatory Visit: Payer: 59 | Admitting: Podiatry

## 2021-09-27 ENCOUNTER — Encounter: Payer: Self-pay | Admitting: Podiatry

## 2021-09-27 ENCOUNTER — Ambulatory Visit (INDEPENDENT_AMBULATORY_CARE_PROVIDER_SITE_OTHER): Payer: 59

## 2021-09-27 DIAGNOSIS — M722 Plantar fascial fibromatosis: Secondary | ICD-10-CM | POA: Diagnosis not present

## 2021-09-27 DIAGNOSIS — B353 Tinea pedis: Secondary | ICD-10-CM

## 2021-09-27 DIAGNOSIS — M67471 Ganglion, right ankle and foot: Secondary | ICD-10-CM

## 2021-09-27 DIAGNOSIS — L603 Nail dystrophy: Secondary | ICD-10-CM | POA: Diagnosis not present

## 2021-09-27 MED ORDER — CLOTRIMAZOLE-BETAMETHASONE 1-0.05 % EX CREA: 1.0000 | TOPICAL_CREAM | Freq: Every day | CUTANEOUS | 0 refills | Status: DC

## 2021-09-27 NOTE — Progress Notes (Signed)
  Subjective:  Patient ID: Dana Bray, female    DOB: [birth year],   MRN: [id number]  Chief Complaint  Patient presents with   Foot Pain    Right heel pain , ongoing for 3 months .Marland Kitchen Great toe pain associated with heel pain. Patient states pain is not constant with heel. No swelling or redness. Patient also states there is a knot near ball of foot that she is concerned about . Dryness is the middle of foot.     51 y.o. female presents for concern of right heel pain that has been present for 3 months. Also relates some great toe pain and thickened toenail as well and a knot on the ball of her foot near the fourth digit Relates the heel pain is constant. Has been stretching and wearing supportive shoes and has noticed some improvement.  Not diabetic.  Marland Kitchen Denies any other pedal complaints. Denies n/v/f/c.   Past Medical History:  Diagnosis Date   Anemia    Lumbar herniated disc    No pertinent past medical history    Pneumonia     Objective:  Physical Exam: Vascular: DP/PT pulses 2/4 bilateral. CFT <3 seconds. Normal hair growth on digits. No edema.  Skin. No lacerations or abrasions bilateral feet. Some scaling and erythema noted to plantar right foot. Thickened right hallux nail. Small 1 cm cyst noted to plantar third digit on right fluctuant and mobile with tendon.  Musculoskeletal: MMT 5/5 bilateral lower extremities in DF, PF, Inversion and Eversion. Deceased ROM in DF of ankle joint. Tender to medial calcaneal tubercle on the right. No pain along arch PT or achilles. No pain with calcaneal squeeze.  Neurological: Sensation intact to light touch.   Assessment:   1. Plantar fasciitis of right foot   2. Ganglion cyst of right foot   3. Onychodystrophy   4. Tinea pedis of right foot      Plan:  Patient was evaluated and treated and all questions answered. Discussed plantar fasciitis with patient.  X-rays reviewed and discussed with patient. No acute fractures or dislocations  noted. Mild spurring noted at inferior calcaneus.  Discussed treatment options including, ice, NSAIDS, supportive shoes, bracing, and stretching. Stretching exercises provided to be done on a daily basis.   Continue with support and anti-inflammatories as needed.  X-rays reviewed and discussed with patient. Discussed ganglion cysts and treatment options with the patient. Discussed dystrophic toenails.  Discussed tinea pedis. Lotrisone provided.  Patient elected to go aspiration of the cyst. Will schedule follow-up at later time to do this.          Louann Sjogren, DPM

## 2021-09-27 NOTE — Patient Instructions (Signed)

## 2021-10-14 ENCOUNTER — Encounter: Payer: Self-pay | Admitting: Podiatry

## 2021-10-14 ENCOUNTER — Ambulatory Visit: Payer: 59 | Admitting: Podiatry

## 2021-10-14 DIAGNOSIS — M67471 Ganglion, right ankle and foot: Secondary | ICD-10-CM | POA: Diagnosis not present

## 2021-10-14 NOTE — Progress Notes (Addendum)
  Subjective:  Patient ID: Dana Bray, female    DOB: Dec 25, 1970,   MRN: [id number]  No chief complaint on file.   51 y.o. female presents for follow-up of ganglion on right third toe. Here today for procedure to remove ganglion. . Denies any other pedal complaints. Denies n/v/f/c.   Past Medical History:  Diagnosis Date   Anemia    Lumbar herniated disc    No pertinent past medical history    Pneumonia     Objective:  Physical Exam: Vascular: DP/PT pulses 2/4 bilateral. CFT <3 seconds. Normal hair growth on digits. No edema.  Skin. No lacerations or abrasions bilateral feet. Some scaling and erythema noted to plantar right foot. Thickened right hallux nail. Small 1 cm cyst noted to plantar third digit on right fluctuant and mobile with tendon.  Musculoskeletal: MMT 5/5 bilateral lower extremities in DF, PF, Inversion and Eversion. Deceased ROM in DF of ankle joint. Tender to medial calcaneal tubercle on the right. No pain along arch PT or achilles. No pain with calcaneal squeeze.  Neurological: Sensation intact to light touch.   Assessment:   1. Ganglion cyst of right foot       Plan:  Patient was evaluated and treated and all questions answered. X-rays reviewed and discussed with patient. Discussed ganglion cysts and treatment options with the patient. Patient elected to go aspiration of the cyst today.  Procedure note below. Advised patient on postprocedure protocol. Discussed that since minimal amount of fluid expressed next step would like be MRI to evaluate and possible surgical removal.  Patient to follow-up in  1 week or sooner if symptoms worsen or fail to improve.    Procedure: Aspiration cyst, right third digit Discussed alternatives, risks, complications and verbal consent was obtained.  Location: Right plantar third digit  Skin Prep: Alcohol. Injectate: 3 cc 1 % lidocaine.  Aspirated cyst with 18 gauge needle, about 0.1 cc of gel like viscous fluid  aspirated consistent with possible ganglion cyst. Still prominent mass noted after attempted aspiration.  Disposition: Patient tolerated procedure well. Injection site dressed with a band-aid. Compression bandage applied.  Post-procedure care was discussed and return precautions discussed.           Lorenda Peck, DPM

## 2021-10-21 ENCOUNTER — Ambulatory Visit: Payer: 59 | Admitting: Podiatry

## 2021-10-21 ENCOUNTER — Encounter: Payer: Self-pay | Admitting: Podiatry

## 2021-10-21 ENCOUNTER — Encounter: Payer: Self-pay | Admitting: Gastroenterology

## 2021-10-21 DIAGNOSIS — M67471 Ganglion, right ankle and foot: Secondary | ICD-10-CM | POA: Diagnosis not present

## 2021-10-21 DIAGNOSIS — M7989 Other specified soft tissue disorders: Secondary | ICD-10-CM

## 2021-10-21 NOTE — Progress Notes (Signed)
  Subjective:  Patient ID: Dana Bray, female    DOB: [birth year],   MRN: [id number]  Chief Complaint  Patient presents with   Cystitis    Patient states cyst has moved to the ball of foot and also has gotten bigger some bruising and discomfort     51 y.o. female presents for follow-up of soft tissue mass plantar right third toe. Here for post-op check and discuss further options. Relates it has not improved and feels slightly bigger.  Denies any other pedal complaints. Denies n/v/f/c.   Past Medical History:  Diagnosis Date   Anemia    Lumbar herniated disc    No pertinent past medical history    Pneumonia     Objective:  Physical Exam: Vascular: DP/PT pulses 2/4 bilateral. CFT <3 seconds. Normal hair growth on digits. No edema.  Skin. No lacerations or abrasions bilateral feet. Some scaling and erythema noted to plantar right foot. Thickened right hallux nail. Small 1 cm cyst noted to plantar third digit on right fluctuant and mobile with tendon.  Musculoskeletal: MMT 5/5 bilateral lower extremities in DF, PF, Inversion and Eversion. Deceased ROM in DF of ankle joint. Tender to medial calcaneal tubercle on the right. No pain along arch PT or achilles. No pain with calcaneal squeeze.  Neurological: Sensation intact to light touch.   Assessment:   1. Soft tissue mass   2. Ganglion cyst of right foot        Plan:  Patient was evaluated and treated and all questions answered. X-rays reviewed and discussed with patient. Discussed soft tissue mass and treatment options.  Doing well after attempted aspiration.  Discussed further work-up with MRI for surgical planning for mass removal.  MRI ordered.  Will follow-up after MRI to discuss possible surgery.           Lorenda Peck, DPM

## 2021-11-08 ENCOUNTER — Ambulatory Visit
Admission: RE | Admit: 2021-11-08 | Discharge: 2021-11-08 | Disposition: A | Discharge status: Home / Self Care | Payer: 59 | Source: Ambulatory Visit | Attending: Podiatry | Admitting: Podiatry

## 2021-11-08 DIAGNOSIS — M7989 Other specified soft tissue disorders: Secondary | ICD-10-CM

## 2021-11-25 ENCOUNTER — Other Ambulatory Visit: Payer: Self-pay

## 2021-11-25 ENCOUNTER — Ambulatory Visit (AMBULATORY_SURGERY_CENTER): Payer: Self-pay | Admitting: *Deleted

## 2021-11-25 VITALS — Ht 64.0 in | Wt 169.0 lb

## 2021-11-25 DIAGNOSIS — Z1211 Encounter for screening for malignant neoplasm of colon: Secondary | ICD-10-CM

## 2021-11-25 MED ORDER — NA SULFATE-K SULFATE-MG SULF 17.5-3.13-1.6 GM/177ML PO SOLN: 1.0000 | Freq: Once | ORAL | 0 refills | Status: AC

## 2021-11-25 NOTE — Progress Notes (Signed)
Pre visit completed in person.  No egg or soy allergy known to patient  No issues known to pt with past sedation with any surgeries or procedures Patient denies ever being told they had issues or difficulty with intubation  No FH of Malignant Hyperthermia Pt is not on diet pills Pt is not on  home 02  Pt is not on blood thinners  Pt denies issues with constipation  No A fib or A flutter  Pt instructed to use Singlecare.com or GoodRx for a price reduction on prep   

## 2021-12-13 ENCOUNTER — Encounter: Payer: Self-pay | Admitting: Gastroenterology

## 2021-12-25 ENCOUNTER — Ambulatory Visit (AMBULATORY_SURGERY_CENTER): Payer: 59 | Admitting: Gastroenterology

## 2021-12-25 ENCOUNTER — Encounter: Payer: Self-pay | Admitting: Gastroenterology

## 2021-12-25 ENCOUNTER — Encounter: Payer: BC Managed Care – PPO | Admitting: Gastroenterology

## 2021-12-25 VITALS — BP 117/70 | HR 57 | Temp 98.7°F | Resp 11 | Ht 65.0 in | Wt 167.0 lb

## 2021-12-25 DIAGNOSIS — Z1211 Encounter for screening for malignant neoplasm of colon: Secondary | ICD-10-CM

## 2021-12-25 MED ORDER — SODIUM CHLORIDE 0.9 % IV SOLN: 500.0000 mL | Freq: Once | INTRAVENOUS | Status: DC

## 2021-12-25 NOTE — Patient Instructions (Signed)
Handout on hemorrhoids given to you today   YOU HAD AN ENDOSCOPIC PROCEDURE TODAY AT THE [facility]:   Refer to the procedure report that was given to you for any specific questions about what was found during the examination.  If the procedure report does not answer your questions, please call your gastroenterologist to clarify.  If you requested that your care partner not be given the details of your procedure findings, then the procedure report has been included in a sealed envelope for you to review at your convenience later.  YOU SHOULD EXPECT: Some feelings of bloating in the abdomen. Passage of more gas than usual.  Walking can help get rid of the air that was put into your GI tract during the procedure and reduce the bloating. If you had a lower endoscopy (such as a colonoscopy or flexible sigmoidoscopy) you may notice spotting of blood in your stool or on the toilet paper. If you underwent a bowel prep for your procedure, you may not have a normal bowel movement for a few days.  Please Note:  You might notice some irritation and congestion in your nose or some drainage.  This is from the oxygen used during your procedure.  There is no need for concern and it should clear up in a day or so.  SYMPTOMS TO REPORT IMMEDIATELY:  Following lower endoscopy (colonoscopy or flexible sigmoidoscopy):  Excessive amounts of blood in the stool  Significant tenderness or worsening of abdominal pains  Swelling of the abdomen that is new, acute  Fever of 100F or higher  For urgent or emergent issues, a gastroenterologist can be reached at any hour by calling [phone]. Do not use MyChart messaging for urgent concerns.    DIET:  We do recommend a small meal at first, but then you may proceed to your regular diet.  Drink plenty of fluids but you should avoid alcoholic beverages for 24 hours.  ACTIVITY:  You should plan to take it easy for the rest of today and you should NOT DRIVE or  use heavy machinery until tomorrow (because of the sedation medicines used during the test).    FOLLOW UP: Our staff will call the number listed on your records the next business day following your procedure.  We will call around 7:15- 8:00 am to check on you and address any questions or concerns that you may have regarding the information given to you following your procedure. If we do not reach you, we will leave a message.      SIGNATURES/CONFIDENTIALITY: You and/or your care partner have signed paperwork which will be entered into your electronic medical record.  These signatures attest to the fact that that the information above on your After Visit Summary has been reviewed and is understood.  Full responsibility of the confidentiality of this discharge information lies with you and/or your care-partner.

## 2021-12-25 NOTE — Op Note (Signed)
[facility] Patient Name: Dana Bray Procedure Date: 12/25/2021 9:41 AM MRN: [id number] Endoscopist: Napoleon Form , MD, [id number] Age: 51 Referring MD:  Date of Birth: 1970-02-11 Gender: Female Account #: [id number] Procedure:                Colonoscopy Indications:              Screening for colorectal malignant neoplasm Medicines:                Monitored Anesthesia Care Procedure:                Pre-Anesthesia Assessment:                           - Prior to the procedure, a History and Physical                            was performed, and patient medications and                            allergies were reviewed. The patient's tolerance of                            previous anesthesia was also reviewed. The risks                            and benefits of the procedure and the sedation                            options and risks were discussed with the patient.                            All questions were answered, and informed consent                            was obtained. Prior Anticoagulants: The patient has                            taken no anticoagulant or antiplatelet agents. ASA                            Grade Assessment: II - A patient with mild systemic                            disease. After reviewing the risks and benefits,                            the patient was deemed in satisfactory condition to                            undergo the procedure.                           After obtaining informed consent, the colonoscope  was passed under direct vision. Throughout the                            procedure, the patient's blood pressure, pulse, and                            oxygen saturations were monitored continuously. The                            Olympus PCF-[id number]) Colonoscope was                            introduced through the anus and advanced to the the                             cecum, identified by appendiceal orifice and                            ileocecal valve. The colonoscopy was performed                            without difficulty. The patient tolerated the                            procedure well. The quality of the bowel                            preparation was excellent. The ileocecal valve,                            appendiceal orifice, and rectum were photographed. Scope In: 9:57:45 AM Scope Out: 10:14:03 AM Scope Withdrawal Time: 0 hours 11 minutes 33 seconds  Total Procedure Duration: 0 hours 16 minutes 18 seconds  Findings:                 The perianal and digital rectal examinations were                            normal.                           Non-bleeding external and internal hemorrhoids were                            found during retroflexion. The hemorrhoids were                            small.                           The exam was otherwise without abnormality. Complications:            No immediate complications. Estimated Blood Loss:     Estimated blood loss was minimal. Impression:               - Non-bleeding external and internal hemorrhoids.                           -  The examination was otherwise normal.                           - No specimens collected. Recommendation:           - Patient has a contact number available for                            emergencies. The signs and symptoms of potential                            delayed complications were discussed with the                            patient. Return to normal activities tomorrow.                            Written discharge instructions were provided to the                            patient.                           - Resume previous diet.                           - Continue present medications.                           - Repeat colonoscopy in 10 years for surveillance. Napoleon Form, MD 12/25/2021 10:18:52 AM This report has been signed  electronically.

## 2021-12-25 NOTE — Progress Notes (Unsigned)
Pt's states no medical or surgical changes since previsit or office visit. 

## 2021-12-25 NOTE — Progress Notes (Unsigned)
Report to PACU, RN, vss, BBS= Clear.  

## 2021-12-25 NOTE — Progress Notes (Unsigned)
[facility] History and Physical   Primary Care Physician:  Patient, No Pcp Per   Reason for Procedure:  Colorectal cancer screening  Plan:    Screening colonoscopy with possible interventions as needed     HPI: Dana Bray is a very pleasant 51 y.o. female here for screening colonoscopy. Denies any nausea, vomiting, abdominal pain, melena or bright red blood per rectum  The risks and benefits as well as alternatives of endoscopic procedure(s) have been discussed and reviewed. All questions answered. The patient agrees to proceed.    Past Medical History:  Diagnosis Date   Anemia    Lumbar herniated disc    No pertinent past medical history    Pneumonia     Past Surgical History:  Procedure Laterality Date   ABLATION     CESAREAN SECTION     CHOLECYSTECTOMY N/A 02/17/2014   Procedure: LAPAROSCOPIC CHOLECYSTECTOMY WITH INTRAOPERATIVE CHOLANGIOGRAM;  Surgeon: Chevis Pretty III, MD;  Location: [facility] OR;  Service: General;  Laterality: N/A;   TONSILLECTOMY     as child   TUBAL LIGATION      Prior to Admission medications   Not on File    No current outpatient medications on file.   Current Facility-Administered Medications  Medication Dose Route Frequency Provider Last Rate Last Admin   0.9 %  sodium chloride infusion  500 mL Intravenous Once Napoleon Form, MD        Allergies as of 12/25/2021 - Review Complete 12/25/2021  Allergen Reaction Noted   Other Anaphylaxis and Hives 01/14/2014    Family History  Problem Relation Age of Onset   Colon cancer Maternal Grandmother     Social History   Socioeconomic History   Marital status: Married    Spouse name: Not on file   Number of children: Not on file   Years of education: Not on file   Highest education level: Not on file  Occupational History   Not on file  Tobacco Use   Smoking status: Never   Smokeless tobacco: Not on file  Substance and Sexual Activity   Alcohol use: Yes     Comment: once a month   Drug use: No   Sexual activity: Yes  Other Topics Concern   Not on file  Social History Narrative   Not on file   Social Determinants of Health   Financial Resource Strain: Not on file  Food Insecurity: Not on file  Transportation Needs: Not on file  Physical Activity: Not on file  Stress: Not on file  Social Connections: Not on file  Intimate Partner Violence: Not on file    Review of Systems:  All other review of systems negative except as mentioned in the HPI.  Physical Exam: Vital signs in last 24 hours: Blood Pressure (Abnormal) 125/99   Pulse 72   Temperature 98.7 F (37.1 C)   Height 5\' 5"  (1.651 m)   Weight 167 lb (75.8 kg)   Oxygen Saturation 99%   Body Mass Index 27.79 kg/m  General:   Alert, NAD Lungs:  Clear .   Heart:  Regular rate and rhythm Abdomen:  Soft, nontender and nondistended. Neuro/Psych:  Alert and cooperative. Normal mood and affect. A and O x 3  Reviewed labs, radiology imaging, old records and pertinent past GI work up  Patient is appropriate for planned procedure(s) and anesthesia in an ambulatory setting   K. , MD [phone]

## 2021-12-26 ENCOUNTER — Telehealth: Payer: Self-pay

## 2021-12-26 ENCOUNTER — Encounter: Payer: Self-pay | Admitting: Gastroenterology

## 2021-12-26 NOTE — Telephone Encounter (Signed)
  Follow up Call-     12/25/2021    8:46 AM  Call back number  Post procedure Call Back phone  # [phone]  Permission to leave phone message Yes     Patient questions:  Do you have a fever, pain , or abdominal swelling? No. Pain Score  0 *  Have you tolerated food without any problems? Yes.    Have you been able to return to your normal activities? Yes.    Do you have any questions about your discharge instructions: Diet   No. Medications  No. Follow up visit  No.  Do you have questions or concerns about your Care? No.  Actions: * If pain score is 4 or above: No action needed, pain <4.
